# Patient Record
Sex: Female | Born: 1990 | Race: Black or African American | Hispanic: No | Marital: Single | Smoking: Never smoker
Health system: Southern US, Community
[De-identification: ages and names within clinical notes are randomized; demographics above are authoritative.]

## PROBLEM LIST (undated history)

## (undated) DIAGNOSIS — K589 Irritable bowel syndrome without diarrhea: Secondary | ICD-10-CM

## (undated) DIAGNOSIS — Z8619 Personal history of other infectious and parasitic diseases: Secondary | ICD-10-CM

## (undated) DIAGNOSIS — F419 Anxiety disorder, unspecified: Secondary | ICD-10-CM

## (undated) DIAGNOSIS — N200 Calculus of kidney: Secondary | ICD-10-CM

## (undated) DIAGNOSIS — I10 Essential (primary) hypertension: Secondary | ICD-10-CM

## (undated) DIAGNOSIS — J45909 Unspecified asthma, uncomplicated: Secondary | ICD-10-CM

## (undated) DIAGNOSIS — J329 Chronic sinusitis, unspecified: Secondary | ICD-10-CM

## (undated) DIAGNOSIS — K2900 Acute gastritis without bleeding: Secondary | ICD-10-CM

## (undated) DIAGNOSIS — K5900 Constipation, unspecified: Secondary | ICD-10-CM

## (undated) DIAGNOSIS — B009 Herpesviral infection, unspecified: Secondary | ICD-10-CM

## (undated) HISTORY — DX: Essential (primary) hypertension: I10

## (undated) HISTORY — DX: Irritable bowel syndrome, unspecified: K58.9

## (undated) HISTORY — DX: Personal history of other infectious and parasitic diseases: Z86.19

## (undated) HISTORY — DX: Calculus of kidney: N20.0

## (undated) HISTORY — DX: Anxiety disorder, unspecified: F41.9

## (undated) HISTORY — PX: COLONOSCOPY: SHX174

---
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
## 2013-07-02 ENCOUNTER — Encounter (HOSPITAL_COMMUNITY): Payer: Self-pay | Admitting: Emergency Medicine

## 2013-07-02 ENCOUNTER — Emergency Department (INDEPENDENT_AMBULATORY_CARE_PROVIDER_SITE_OTHER)
Admission: EM | Admit: 2013-07-02 | Discharge: 2013-07-02 | Disposition: A | Discharge status: Home / Self Care | Payer: BC Managed Care – PPO | Source: Home / Self Care | Attending: Family Medicine | Admitting: Family Medicine

## 2013-07-02 DIAGNOSIS — N390 Urinary tract infection, site not specified: Secondary | ICD-10-CM

## 2013-07-02 HISTORY — DX: Constipation, unspecified: K59.00

## 2013-07-02 HISTORY — DX: Acute gastritis without bleeding: K29.00

## 2013-07-02 LAB — POCT URINALYSIS DIP (DEVICE)
Bilirubin Urine: NEGATIVE
Glucose, UA: NEGATIVE mg/dL
Hgb urine dipstick: NEGATIVE
Ketones, ur: NEGATIVE mg/dL
Nitrite: NEGATIVE
Protein, ur: NEGATIVE mg/dL
Specific Gravity, Urine: 1.025 (ref 1.005–1.030)
Urobilinogen, UA: 0.2 mg/dL (ref 0.0–1.0)
pH: 7 (ref 5.0–8.0)

## 2013-07-02 MED ORDER — FLUCONAZOLE 150 MG PO TABS: 150.0000 mg | ORAL_TABLET | Freq: Every day | ORAL | Status: DC

## 2013-07-02 MED ORDER — NITROFURANTOIN MONOHYD MACRO 100 MG PO CAPS: 100.0000 mg | ORAL_CAPSULE | Freq: Two times a day (BID) | ORAL | Status: DC

## 2013-07-02 NOTE — ED Provider Notes (Signed)
CSN: [id number]     Arrival date & time 07/02/13  1943 History   First MD Initiated Contact with Patient 07/02/13 2048     Chief Complaint  Patient presents with  . Urinary Tract Infection     (Consider location/radiation/quality/duration/timing/severity/associated sxs/prior Treatment) Patient is a 23 y.o. female presenting with urinary tract infection. The history is provided by the patient. No language interpreter was used.  Urinary Tract Infection This is a new problem. The problem occurs constantly. The problem has not changed since onset.Pertinent negatives include no abdominal pain. Nothing aggravates the symptoms. Nothing relieves the symptoms. She has tried nothing for the symptoms.  Pt complains of burning with urination and a odor to urine  Past Medical History  Diagnosis Date  . Sinus infection   . Constipation   . Gastritis, acute   . Renal disorder 10/2011    kidney stone   History reviewed. No pertinent past surgical history. Family History  Problem Relation Age of Onset  . Cancer Mother     uterus   History  Substance Use Topics  . Smoking status: Never Smoker   . Smokeless tobacco: Not on file  . Alcohol Use: Yes     Comment: occasional   OB History   Grav Para Term Preterm Abortions TAB SAB Ect Mult Living                 Review of Systems  Gastrointestinal: Negative for abdominal pain.  Genitourinary: Positive for urgency.  All other systems reviewed and are negative.      Allergies  Amoxicillin  Home Medications   Current Outpatient Rx  Name  Route  Sig  Dispense  Refill  . amoxicillin (AMOXIL) 500 MG capsule   Oral   Take 500 mg by mouth daily. Take for 10 days         . diphenhydrAMINE (BENADRYL) 25 mg capsule   Oral   Take 25 mg by mouth every 6 (six) hours as needed. For allergic reaction         . EPINEPHrine (EPIPEN 2-PAK) 0.3 mg/0.3 mL DEVI   Intramuscular   Inject 0.3 mg into the muscle once. For allergic reaction        . predniSONE (DELTASONE) 20 MG tablet   Oral   Take 20-60 mg by mouth See admin instructions. Take 3 tabs daily x2 days, 2 tabs daily x2 days & 1 tab daily x2 days          BP 126/77  Pulse 84  Temp(Src) 98.2 F (36.8 C) (Oral)  Resp 18  SpO2 100%  LMP 06/16/2013 Physical Exam  Nursing note and vitals reviewed. Constitutional: She appears well-developed and well-nourished.  HENT:  Head: Normocephalic and atraumatic.  Eyes: Pupils are equal, round, and reactive to light.  Neck: Normal range of motion. Neck supple.  Cardiovascular: Normal rate and normal heart sounds.   Pulmonary/Chest: Effort normal and breath sounds normal.  Abdominal: Soft.  Neurological: She is alert.  Skin: Skin is warm.    ED Course  Procedures (including critical care time) Labs Review Labs Reviewed - No data to display Imaging Review No results found.    MDM   Final diagnoses:  None    Diflucan and Macrobid.       Lonia SkinnerLeslie K DellSofia, PA-C 07/02/13 2135

## 2013-07-02 NOTE — Discharge Instructions (Signed)
Urinary Tract Infection  Urinary tract infections (UTIs) can develop anywhere along your urinary tract. Your urinary tract is your body's drainage system for removing wastes and extra water. Your urinary tract includes two kidneys, two ureters, a bladder, and a urethra. Your kidneys are a pair of bean-shaped organs. Each kidney is about the size of your fist. They are located below your ribs, one on each side of your spine.  CAUSES  Infections are caused by microbes, which are microscopic organisms, including fungi, viruses, and bacteria. These organisms are so small that they can only be seen through a microscope. Bacteria are the microbes that most commonly cause UTIs.  SYMPTOMS   Symptoms of UTIs may vary by age and gender of the patient and by the location of the infection. Symptoms in young women typically include a frequent and intense urge to urinate and a painful, burning feeling in the bladder or urethra during urination. Older women and men are more likely to be tired, shaky, and weak and have muscle aches and abdominal pain. A fever may mean the infection is in your kidneys. Other symptoms of a kidney infection include pain in your back or sides below the ribs, nausea, and vomiting.  DIAGNOSIS  To diagnose a UTI, your caregiver will ask you about your symptoms. Your caregiver also will ask to provide a urine sample. The urine sample will be tested for bacteria and white blood cells. White blood cells are made by your body to help fight infection.  TREATMENT   Typically, UTIs can be treated with medication. Because most UTIs are caused by a bacterial infection, they usually can be treated with the use of antibiotics. The choice of antibiotic and length of treatment depend on your symptoms and the type of bacteria causing your infection.  HOME CARE INSTRUCTIONS   If you were prescribed antibiotics, take them exactly as your caregiver instructs you. Finish the medication even if you feel better after you  have only taken some of the medication.   Drink enough water and fluids to keep your urine clear or pale yellow.   Avoid caffeine, tea, and carbonated beverages. They tend to irritate your bladder.   Empty your bladder often. Avoid holding urine for long periods of time.   Empty your bladder before and after sexual intercourse.   After a bowel movement, women should cleanse from front to back. Use each tissue only once.  SEEK MEDICAL CARE IF:    You have back pain.   You develop a fever.   Your symptoms do not begin to resolve within 3 days.  SEEK IMMEDIATE MEDICAL CARE IF:    You have severe back pain or lower abdominal pain.   You develop chills.   You have nausea or vomiting.   You have continued burning or discomfort with urination.  MAKE SURE YOU:    Understand these instructions.   Will watch your condition.   Will get help right away if you are not doing well or get worse.  Document Released: 02/01/2005 Document Revised: 10/24/2011 Document Reviewed: 06/02/2011  ExitCare Patient Information 2014 [employer].

## 2013-07-02 NOTE — ED Notes (Signed)
Had  UTI in Dec.  Took antibiotics and got a yeast infection and took a Diflucan ( late Dec.).  End January she burning with urination off and on, low back pain, low abd. pain and urine has an odor.  No hematuria.

## 2013-07-04 NOTE — ED Provider Notes (Signed)
Medical screening examination/treatment/procedure(s) were performed by a resident physician or non-physician practitioner and as the supervising physician I was immediately available for consultation/collaboration.  Kyeisha Janowicz, MD    Janellie Tennison S Ileigh Mettler, MD 07/04/13 0743 

## 2013-07-07 ENCOUNTER — Other Ambulatory Visit (HOSPITAL_COMMUNITY)
Admission: RE | Admit: 2013-07-07 | Discharge: 2013-07-07 | Disposition: A | Discharge status: Home / Self Care | Payer: BC Managed Care – PPO | Source: Ambulatory Visit | Attending: Emergency Medicine | Admitting: Emergency Medicine

## 2013-07-07 ENCOUNTER — Encounter (HOSPITAL_COMMUNITY): Payer: Self-pay | Admitting: Emergency Medicine

## 2013-07-07 ENCOUNTER — Emergency Department (INDEPENDENT_AMBULATORY_CARE_PROVIDER_SITE_OTHER)
Admission: EM | Admit: 2013-07-07 | Discharge: 2013-07-07 | Disposition: A | Discharge status: Home / Self Care | Payer: BC Managed Care – PPO | Source: Home / Self Care | Attending: Emergency Medicine | Admitting: Emergency Medicine

## 2013-07-07 DIAGNOSIS — N760 Acute vaginitis: Secondary | ICD-10-CM

## 2013-07-07 DIAGNOSIS — N730 Acute parametritis and pelvic cellulitis: Secondary | ICD-10-CM

## 2013-07-07 DIAGNOSIS — Z113 Encounter for screening for infections with a predominantly sexual mode of transmission: Secondary | ICD-10-CM | POA: Insufficient documentation

## 2013-07-07 DIAGNOSIS — N390 Urinary tract infection, site not specified: Secondary | ICD-10-CM

## 2013-07-07 LAB — HIV ANTIBODY (ROUTINE TESTING W REFLEX): HIV: NONREACTIVE

## 2013-07-07 LAB — POCT URINALYSIS DIP (DEVICE)
Bilirubin Urine: NEGATIVE
Glucose, UA: NEGATIVE mg/dL
Hgb urine dipstick: NEGATIVE
Ketones, ur: NEGATIVE mg/dL
Leukocytes, UA: NEGATIVE
Nitrite: NEGATIVE
Protein, ur: NEGATIVE mg/dL
Specific Gravity, Urine: 1.025 (ref 1.005–1.030)
Urobilinogen, UA: 0.2 mg/dL (ref 0.0–1.0)
pH: 6 (ref 5.0–8.0)

## 2013-07-07 LAB — POCT PREGNANCY, URINE: Preg Test, Ur: NEGATIVE

## 2013-07-07 MED ORDER — METRONIDAZOLE 500 MG PO TABS: 500.0000 mg | ORAL_TABLET | Freq: Three times a day (TID) | ORAL | Status: DC

## 2013-07-07 MED ORDER — CEFTRIAXONE SODIUM 250 MG IJ SOLR
250.0000 mg | Freq: Once | INTRAMUSCULAR | Status: AC
  Administered 2013-07-07: 250 mg via INTRAMUSCULAR

## 2013-07-07 MED ORDER — AZITHROMYCIN 250 MG PO TABS
ORAL_TABLET | ORAL | Status: AC
  Filled 2013-07-07: qty 4

## 2013-07-07 MED ORDER — AZITHROMYCIN 250 MG PO TABS
ORAL_TABLET | ORAL | Status: AC
  Filled 2013-07-07: qty 1

## 2013-07-07 MED ORDER — ONDANSETRON HCL 8 MG PO TABS: 8.0000 mg | ORAL_TABLET | Freq: Three times a day (TID) | ORAL | Status: DC | PRN

## 2013-07-07 MED ORDER — CEPHALEXIN 500 MG PO CAPS: 500.0000 mg | ORAL_CAPSULE | Freq: Three times a day (TID) | ORAL | Status: DC

## 2013-07-07 MED ORDER — LIDOCAINE HCL (PF) 1 % IJ SOLN
INTRAMUSCULAR | Status: AC
  Filled 2013-07-07: qty 5

## 2013-07-07 MED ORDER — AZITHROMYCIN 250 MG PO TABS
1000.0000 mg | ORAL_TABLET | Freq: Once | ORAL | Status: AC
  Administered 2013-07-07: 1000 mg via ORAL

## 2013-07-07 MED ORDER — CEFTRIAXONE SODIUM 250 MG IJ SOLR
INTRAMUSCULAR | Status: AC
  Filled 2013-07-07: qty 250

## 2013-07-07 NOTE — ED Notes (Signed)
Pt waiting blood work.

## 2013-07-07 NOTE — Discharge Instructions (Signed)
Bacterial Vaginosis °Bacterial vaginosis is a vaginal infection that occurs when the normal balance of bacteria in the vagina is disrupted. It results from an overgrowth of certain bacteria. This is the most common vaginal infection in women of childbearing age. Treatment is important to prevent complications, especially in pregnant women, as it can cause a premature delivery. °CAUSES  °Bacterial vaginosis is caused by an increase in harmful bacteria that are normally present in smaller amounts in the vagina. Several different kinds of bacteria can cause bacterial vaginosis. However, the reason that the condition develops is not fully understood. °RISK FACTORS °Certain activities or behaviors can put you at an increased risk of developing bacterial vaginosis, including: °· Having a new sex partner or multiple sex partners. °· Douching. °· Using an intrauterine device (IUD) for contraception. °Women do not get bacterial vaginosis from toilet seats, bedding, swimming pools, or contact with objects around them. °SIGNS AND SYMPTOMS  °Some women with bacterial vaginosis have no signs or symptoms. Common symptoms include: °· Grey vaginal discharge. °· A fishlike odor with discharge, especially after sexual intercourse. °· Itching or burning of the vagina and vulva. °· Burning or pain with urination. °DIAGNOSIS  °Your health care provider will take a medical history and examine the vagina for signs of bacterial vaginosis. A sample of vaginal fluid may be taken. Your health care provider will look at this sample under a microscope to check for bacteria and abnormal cells. A vaginal pH test may also be done.  °TREATMENT  °Bacterial vaginosis may be treated with antibiotic medicines. These may be given in the form of a pill or a vaginal cream. A second round of antibiotics may be prescribed if the condition comes back after treatment.  °HOME CARE INSTRUCTIONS  °· Only take over-the-counter or prescription medicines as  directed by your health care provider. °· If antibiotic medicine was prescribed, take it as directed. Make sure you finish it even if you start to feel better. °· Do not have sex until treatment is completed. °· Tell all sexual partners that you have a vaginal infection. They should see their health care provider and be treated if they have problems, such as a mild rash or itching. °· Practice safe sex by using condoms and only having one sex partner. °SEEK MEDICAL CARE IF:  °· Your symptoms are not improving after 3 days of treatment. °· You have increased discharge or pain. °· You have a fever. °MAKE SURE YOU:  °· Understand these instructions. °· Will watch your condition. °· Will get help right away if you are not doing well or get worse. °FOR MORE INFORMATION  °Centers for Disease Control and Prevention, Division of STD Prevention: www.cdc.gov/std °American Sexual Health Association (ASHA): www.ashastd.org  °Document Released: 04/24/2005 Document Revised: 02/12/2013 Document Reviewed: 12/04/2012 °ExitCare® Patient Information ©2014 [employer]. ° °Urinary Tract Infection °Urinary tract infections (UTIs) can develop anywhere along your urinary tract. Your urinary tract is your body's drainage system for removing wastes and extra water. Your urinary tract includes two kidneys, two ureters, a bladder, and a urethra. Your kidneys are a pair of bean-shaped organs. Each kidney is about the size of your fist. They are located below your ribs, one on each side of your spine. °CAUSES °Infections are caused by microbes, which are microscopic organisms, including fungi, viruses, and bacteria. These organisms are so small that they can only be seen through a microscope. Bacteria are the microbes that most commonly cause UTIs. °SYMPTOMS  °Symptoms of   UTIs may vary by age and gender of the patient and by the location of the infection. Symptoms in young women typically include a frequent and intense urge to urinate and a  painful, burning feeling in the bladder or urethra during urination. Older women and men are more likely to be tired, shaky, and weak and have muscle aches and abdominal pain. A fever may mean the infection is in your kidneys. Other symptoms of a kidney infection include pain in your back or sides below the ribs, nausea, and vomiting. °DIAGNOSIS °To diagnose a UTI, your caregiver will ask you about your symptoms. Your caregiver also will ask to provide a urine sample. The urine sample will be tested for bacteria and white blood cells. White blood cells are made by your body to help fight infection. °TREATMENT  °Typically, UTIs can be treated with medication. Because most UTIs are caused by a bacterial infection, they usually can be treated with the use of antibiotics. The choice of antibiotic and length of treatment depend on your symptoms and the type of bacteria causing your infection. °HOME CARE INSTRUCTIONS °· If you were prescribed antibiotics, take them exactly as your caregiver instructs you. Finish the medication even if you feel better after you have only taken some of the medication. °· Drink enough water and fluids to keep your urine clear or pale yellow. °· Avoid caffeine, tea, and carbonated beverages. They tend to irritate your bladder. °· Empty your bladder often. Avoid holding urine for long periods of time. °· Empty your bladder before and after sexual intercourse. °· After a bowel movement, women should cleanse from front to back. Use each tissue only once. °SEEK MEDICAL CARE IF:  °· You have back pain. °· You develop a fever. °· Your symptoms do not begin to resolve within 3 days. °SEEK IMMEDIATE MEDICAL CARE IF:  °· You have severe back pain or lower abdominal pain. °· You develop chills. °· You have nausea or vomiting. °· You have continued burning or discomfort with urination. °MAKE SURE YOU:  °· Understand these instructions. °· Will watch your condition. °· Will get help right away if you are  not doing well or get worse. °Document Released: 02/01/2005 Document Revised: 10/24/2011 Document Reviewed: 06/02/2011 °ExitCare® Patient Information ©2014 [employer]. ° ° °To restore the normal balance of "good bacteria" in your system.  Take a probiotic once daily.  These can be gotten over the counter at the drug store without a prescription and come under various brand names such as Culturelle, Align, Florastore, and Phillips.  The best thing to do is to ask your pharmacist to recommend a good probiotic that is not too expensive.  ° °

## 2013-07-07 NOTE — ED Provider Notes (Addendum)
Chief Complaint   Chief Complaint  Patient presents with  . Allergic Reaction    History of Present Illness   Christy Little is a 23 year old female who was here on February 25 with back and abdominal pain, dysuria, and malodorous urine. Her UA showed a trace of WBCs, but was otherwise negative. She did not have urine culture and a pelvic exam was not done. She was given Macrobid and Diflucan. She took the Macrobid but not the Diflucan. She felt like she was getting somewhat better up until this past Saturday, 3 days ago, when she developed nausea. She stopped the Macrobid, thinking that this was causing her nausea. The nausea is better right now. The patient states even the smell of food cooking would make her nauseous. She continues to have bilateral pelvic pain, dysuria, urgency, vaginal discharge, itching, and odor. She also had some diarrhea and borborygmus as well. Her last menstrual period was February 29. She is sexually active with consistent use of condoms.  Review of Systems   Other than as noted above, the patient denies any of the following symptoms: Constitutional:  No fever, chills, weight loss or anorexia. Lungs:  No cough or shortness of breath. Heart:  No chest pain, palpitations, syncope or edema.  Abdomen:  No nausea, vomiting, hematememesis, melena, diarrhea, or hematochezia. GU:  No dysuria, frequency, urgency, or hematuria. Gyn:  No vaginal discharge, itching, abnormal bleeding, dyspareunia, or pelvic pain.  PMFSH   Past medical history, family history, social history, meds, and allergies were reviewed. She is allergic to amoxicillin. She has chronic constipation and gastritis.  Physical Exam     Vital signs:  BP 114/61  Pulse 83  Temp(Src) 99.5 F (37.5 C) (Oral)  Resp 12  SpO2 100%  LMP 06/16/2013 Gen:  Alert, oriented, in no distress. Lungs:  Breath sounds clear and equal bilaterally.  No wheezes, rales or rhonchi. Heart:  Regular rhythm.  No gallops or  murmers.   Abdomen:  Soft, flat, nondistended. No organomegaly or mass, bowel sounds are normally active. She has bilateral pelvic pain to palpation. No guarding or rebound. Pelvic:  Normal external genitalia, vaginal and cervical mucosa were normal. There is a copious, white, homogeneous, malodorous vaginal discharge. She has mild pelvic pain on movement of the cervix, moderate pain to palpation of the uterus and both adnexa, normal size uterus, and no adnexal masses.  DNA probes for gonorrhea, Chlamydia, Trichomonas, Gardnerella, and Candida were obtained. Skin:  Clear, warm and dry.  No rash.  Labs   Results for orders placed during the hospital encounter of 07/07/13  POCT URINALYSIS DIP (DEVICE)      Result Value Ref Range   Glucose, UA NEGATIVE  NEGATIVE mg/dL   Bilirubin Urine NEGATIVE  NEGATIVE   Ketones, ur NEGATIVE  NEGATIVE mg/dL   Specific Gravity, Urine 1.025  1.005 - 1.030   Hgb urine dipstick NEGATIVE  NEGATIVE   pH 6.0  5.0 - 8.0   Protein, ur NEGATIVE  NEGATIVE mg/dL   Urobilinogen, UA 0.2  0.0 - 1.0 mg/dL   Nitrite NEGATIVE  NEGATIVE   Leukocytes, UA NEGATIVE  NEGATIVE  POCT PREGNANCY, URINE      Result Value Ref Range   Preg Test, Ur NEGATIVE  NEGATIVE    Urine was cultured.  Course in [facility]gent [facility] Center   Given Rocephin 250 mg IM and azithromycin 1000 mg by mouth.  Assessment   The primary encounter diagnosis was UTI (lower urinary tract infection). Diagnoses  of Vaginitis and PID (acute pelvic inflammatory disease) were also pertinent to this visit.  Plan     1.  Meds:  The following meds were prescribed:   New Prescriptions   CEPHALEXIN (KEFLEX) 500 MG CAPSULE    Take 1 capsule (500 mg total) by mouth 3 (three) times daily.   METRONIDAZOLE (FLAGYL) 500 MG TABLET    Take 1 tablet (500 mg total) by mouth 3 (three) times daily.   ONDANSETRON (ZOFRAN) 8 MG TABLET    Take 1 tablet (8 mg total) by mouth every 8 (eight) hours as needed for nausea or vomiting.     2.  Patient Education/Counseling:  The patient was given appropriate handouts, self care instructions, and instructed in symptomatic relief.  Suggested extra liquids and use of a probiotic.  3.  Follow up:  The patient was told to follow up here if no better in 3 to 4 days, or sooner if becoming worse in any way, and given some red flag symptoms such as worsening pain, fever, vomiting, or evidence of GI bleeding which would prompt immediate return.  Follow up here as necessary.    Reuben Likesavid C Londell Noll, MD 07/07/13 [phone]  Addendum: After receiving the Rocephin and azithromycin, she noted a sensation of a lump in her throat, chest pain, abdominal pain, and some itching on her buttocks. She did not have a rash. She was allowed to sit in the exam room for about 30 minutes. Thereafter her symptoms resolved. She was able to drink ginger ale and ate some graham crackers. She did not have any respiratory distress. She did not have any skin rash. Her lungs remained clear, and her oropharynx was clear. I think her symptoms may have been a relative intolerance to the azithromycin. I do not think she had an allergic reaction to the Rocephin. After about half an hour she felt back to normal, and was allowed to leave. I stressed the importance of returning if she should have any further symptoms.  Reuben Likesavid C Conita Amenta, MD 07/07/13 [phone]

## 2013-07-07 NOTE — ED Notes (Signed)
Pt given injection will discharge at 4:26.  Mw,cma

## 2013-07-07 NOTE — ED Notes (Signed)
Reports possible allergic reaction to meds prescribed on 2/25.  States having nausea and loss of appetite.  uti symptoms gradually gotten worse.

## 2013-07-08 LAB — CERVICOVAGINAL ANCILLARY ONLY
Chlamydia: NEGATIVE
Neisseria Gonorrhea: NEGATIVE
Wet Prep (BD Affirm): NEGATIVE
Wet Prep (BD Affirm): POSITIVE — AB
Wet Prep (BD Affirm): POSITIVE — AB

## 2013-07-08 LAB — URINE CULTURE
Colony Count: NO GROWTH
Culture: NO GROWTH
Special Requests: NORMAL

## 2013-07-09 NOTE — ED Notes (Signed)
GC/Chlamydia neg., Affirm: Candida and Gardnerella pos., Trich neg., HIV non-reactive, Urine culture: No growth.  Pt. adequately treated with Flagyl.  Message sent to Dr. Lorenz CoasterKeller. Christy MoselleYork, Christy Little 07/09/2013

## 2013-07-10 ENCOUNTER — Telehealth (HOSPITAL_COMMUNITY): Payer: Self-pay | Admitting: *Deleted

## 2013-07-10 ENCOUNTER — Telehealth (HOSPITAL_COMMUNITY): Payer: Self-pay | Admitting: Emergency Medicine

## 2013-07-10 MED ORDER — FLUCONAZOLE 150 MG PO TABS: 150.0000 mg | ORAL_TABLET | Freq: Once | ORAL | Status: DC

## 2013-07-10 NOTE — ED Notes (Addendum)
Dr.  Lorenz CoasterKeller [facility] [address]  I called pt. and left a message to call. Call 1. Vassie MoselleYork, Shirlette Scarber M 07/10/2013 Pt. called back.  Pt. verified x 2 and given results.  Pt. told to finish all of Flagyl for bacterial vaginosis and take Diflucan for the yeast infection. Pt. voiced understanding. Vassie MoselleYork, Jayvyn Haselton M 07/10/2013

## 2013-07-10 NOTE — Telephone Encounter (Signed)
Message copied by Reuben LikesKELLER, Seynabou Fults C on Thu Jul 10, 2013 12:47 PM ------      Message from: Vassie MoselleYORK, SUZANNE M      Created: Wed Jul 09, 2013  3:46 PM      Regarding: lab       Candida and Gardnerella pos. Rest of labs neg.  Treated with Flagyl.  Do you want to treat the other?      Vassie MoselleYork, Suzanne M      07/09/2013       ------

## 2013-07-10 NOTE — ED Notes (Signed)
DNA probe was positive for Candida. We'll treat with Diflucan 150 mg, #1, one tablet one time only. Will need to call the patient and inform her of this result.  Reuben Likesavid C Lawson Isabell, MD 07/10/13 1248

## 2013-11-18 ENCOUNTER — Encounter (HOSPITAL_COMMUNITY): Payer: Self-pay | Admitting: Emergency Medicine

## 2013-11-18 ENCOUNTER — Emergency Department (INDEPENDENT_AMBULATORY_CARE_PROVIDER_SITE_OTHER)
Admission: EM | Admit: 2013-11-18 | Discharge: 2013-11-18 | Disposition: A | Discharge status: Home / Self Care | Payer: BC Managed Care – PPO | Source: Home / Self Care

## 2013-11-18 DIAGNOSIS — J069 Acute upper respiratory infection, unspecified: Secondary | ICD-10-CM

## 2013-11-18 DIAGNOSIS — R350 Frequency of micturition: Secondary | ICD-10-CM

## 2013-11-18 LAB — POCT URINALYSIS DIP (DEVICE)
Bilirubin Urine: NEGATIVE
Glucose, UA: NEGATIVE mg/dL
Hgb urine dipstick: NEGATIVE
Ketones, ur: NEGATIVE mg/dL
Leukocytes, UA: NEGATIVE
Nitrite: NEGATIVE
Protein, ur: NEGATIVE mg/dL
Specific Gravity, Urine: 1.01 (ref 1.005–1.030)
Urobilinogen, UA: 0.2 mg/dL (ref 0.0–1.0)
pH: 6 (ref 5.0–8.0)

## 2013-11-18 LAB — POCT PREGNANCY, URINE: Preg Test, Ur: NEGATIVE

## 2013-11-18 MED ORDER — IPRATROPIUM BROMIDE 0.06 % NA SOLN: 2.0000 | Freq: Four times a day (QID) | NASAL | Status: DC

## 2013-11-18 MED ORDER — CETIRIZINE HCL 10 MG PO TABS: 10.0000 mg | ORAL_TABLET | Freq: Every day | ORAL | Status: DC

## 2013-11-18 NOTE — ED Provider Notes (Signed)
CSN: [id number]     Arrival date & time 11/18/13  1502 History   None    Chief Complaint  Patient presents with  . URI   (Consider location/radiation/quality/duration/timing/severity/associated sxs/prior Treatment) Patient is a 23 y.o. female presenting with URI. The history is provided by the patient.  URI Presenting symptoms: congestion, cough, rhinorrhea and sore throat   Presenting symptoms: no fever   Severity:  Moderate Onset quality:  Gradual Duration:  2 days Progression:  Worsening Chronicity:  Recurrent Relieved by:  None tried Worsened by:  Nothing tried Ineffective treatments:  None tried Associated symptoms: sneezing     Past Medical History  Diagnosis Date  . Sinus infection   . Constipation   . Gastritis, acute   . Renal disorder 10/2011    kidney stone   History reviewed. No pertinent past surgical history. Family History  Problem Relation Age of Onset  . Cancer Mother     uterus   History  Substance Use Topics  . Smoking status: Never Smoker   . Smokeless tobacco: Not on file  . Alcohol Use: Yes     Comment: occasional   OB History   Grav Para Term Preterm Abortions TAB SAB Ect Mult Living                 Review of Systems  Constitutional: Negative.  Negative for fever.  HENT: Positive for congestion, postnasal drip, rhinorrhea, sneezing and sore throat.   Respiratory: Positive for cough.   Gastrointestinal: Negative.   Genitourinary: Positive for dysuria and frequency. Negative for urgency, menstrual problem and pelvic pain.  Musculoskeletal: Positive for back pain.    Allergies  Amoxicillin  Home Medications   Prior to Admission medications   Medication Sig Start Date End Date Taking? Authorizing Provider  cephALEXin (KEFLEX) 500 MG capsule Take 1 capsule (500 mg total) by mouth 3 (three) times daily. 07/07/13   Reuben Likes, MD  cetirizine (ZYRTEC) 10 MG tablet Take 1 tablet (10 mg total) by mouth daily. 11/18/13   Linna Hoff, MD   diphenhydrAMINE (BENADRYL) 25 mg capsule Take 25 mg by mouth every 6 (six) hours as needed. For allergic reaction    Historical Provider, MD  EPINEPHrine (EPIPEN 2-PAK) 0.3 mg/0.3 mL DEVI Inject 0.3 mg into the muscle once. For allergic reaction    Historical Provider, MD  fluconazole (DIFLUCAN) 150 MG tablet Take 1 tablet (150 mg total) by mouth daily. 07/02/13   Elson Areas, PA-C  fluconazole (DIFLUCAN) 150 MG tablet Take 1 tablet (150 mg total) by mouth once. 07/10/13   Reuben Likes, MD  ipratropium (ATROVENT) 0.06 % nasal spray Place 2 sprays into both nostrils 4 (four) times daily. 11/18/13   Linna Hoff, MD  metroNIDAZOLE (FLAGYL) 500 MG tablet Take 1 tablet (500 mg total) by mouth 3 (three) times daily. 07/07/13   Reuben Likes, MD  nitrofurantoin, macrocrystal-monohydrate, (MACROBID) 100 MG capsule Take 1 capsule (100 mg total) by mouth 2 (two) times daily. 07/02/13   Elson Areas, PA-C  ondansetron (ZOFRAN) 8 MG tablet Take 1 tablet (8 mg total) by mouth every 8 (eight) hours as needed for nausea or vomiting. 07/07/13   Reuben Likes, MD  predniSONE (DELTASONE) 20 MG tablet Take 20-60 mg by mouth See admin instructions. Take 3 tabs daily x2 days, 2 tabs daily x2 days & 1 tab daily x2 days    Historical Provider, MD   BP 126/80  Pulse 92  Temp(Src) 99.8 F (37.7 C) (Oral)  Resp 14  SpO2 100%  LMP 11/17/2013 Physical Exam  Nursing note and vitals reviewed. Constitutional: She is oriented to person, place, and time. She appears well-developed and well-nourished.  HENT:  Right Ear: External ear normal.  Left Ear: External ear normal.  Nose: Mucosal edema and rhinorrhea present.  Mouth/Throat: Mucous membranes are normal. Posterior oropharyngeal erythema present. No oropharyngeal exudate.  Neck: Normal range of motion. Neck supple.  Abdominal: Soft. Bowel sounds are normal. There is no tenderness.  Lymphadenopathy:    She has no cervical adenopathy.  Neurological: She is alert  and oriented to person, place, and time.  Skin: Skin is warm and dry.    ED Course  Procedures (including critical care time) Labs Review Labs Reviewed  POCT URINALYSIS DIP (DEVICE)  POCT PREGNANCY, URINE   U/a neg. Imaging Review No results found.   MDM   1. URI (upper respiratory infection)   2. Urinary frequency        Linna HoffJames D Lamberto Dinapoli, MD 11/18/13 1635

## 2013-11-18 NOTE — ED Notes (Signed)
Pt c/o cold/sinus sx x 2 days Sx include: runny nose, abd/back pain, HA, ST Denies f/v/n/d Taking tyle w/no relief Alert w/no signs of acute distress.

## 2013-11-18 NOTE — Discharge Instructions (Signed)
Drink plenty of fluids as discussed, use medicine as prescribed, . Empty bladder after sexual intercourse,  Return or see your doctor if further problems

## 2014-04-19 ENCOUNTER — Encounter (HOSPITAL_COMMUNITY): Payer: Self-pay

## 2014-04-19 ENCOUNTER — Emergency Department (HOSPITAL_COMMUNITY)
Admission: EM | Admit: 2014-04-19 | Discharge: 2014-04-19 | Disposition: A | Discharge status: Home / Self Care | Payer: BC Managed Care – PPO | Attending: Emergency Medicine | Admitting: Emergency Medicine

## 2014-04-19 DIAGNOSIS — N12 Tubulo-interstitial nephritis, not specified as acute or chronic: Secondary | ICD-10-CM | POA: Insufficient documentation

## 2014-04-19 DIAGNOSIS — Z792 Long term (current) use of antibiotics: Secondary | ICD-10-CM | POA: Insufficient documentation

## 2014-04-19 DIAGNOSIS — Z880 Allergy status to penicillin: Secondary | ICD-10-CM | POA: Insufficient documentation

## 2014-04-19 DIAGNOSIS — Z87440 Personal history of urinary (tract) infections: Secondary | ICD-10-CM | POA: Insufficient documentation

## 2014-04-19 DIAGNOSIS — Z87442 Personal history of urinary calculi: Secondary | ICD-10-CM | POA: Diagnosis not present

## 2014-04-19 DIAGNOSIS — Z79899 Other long term (current) drug therapy: Secondary | ICD-10-CM | POA: Insufficient documentation

## 2014-04-19 DIAGNOSIS — R1011 Right upper quadrant pain: Secondary | ICD-10-CM | POA: Diagnosis present

## 2014-04-19 DIAGNOSIS — Z8619 Personal history of other infectious and parasitic diseases: Secondary | ICD-10-CM | POA: Diagnosis not present

## 2014-04-19 DIAGNOSIS — Z8709 Personal history of other diseases of the respiratory system: Secondary | ICD-10-CM | POA: Insufficient documentation

## 2014-04-19 DIAGNOSIS — Z8719 Personal history of other diseases of the digestive system: Secondary | ICD-10-CM | POA: Insufficient documentation

## 2014-04-19 DIAGNOSIS — Z3202 Encounter for pregnancy test, result negative: Secondary | ICD-10-CM | POA: Diagnosis not present

## 2014-04-19 LAB — CBC WITH DIFFERENTIAL/PLATELET
Basophils Absolute: 0 10*3/uL (ref 0.0–0.1)
Basophils Relative: 0 % (ref 0–1)
Eosinophils Absolute: 0 10*3/uL (ref 0.0–0.7)
Eosinophils Relative: 0 % (ref 0–5)
HCT: 41.7 % (ref 36.0–46.0)
Hemoglobin: 13.8 g/dL (ref 12.0–15.0)
Lymphocytes Relative: 11 % — ABNORMAL LOW (ref 12–46)
Lymphs Abs: 1.5 10*3/uL (ref 0.7–4.0)
MCH: 27.5 pg (ref 26.0–34.0)
MCHC: 33.1 g/dL (ref 30.0–36.0)
MCV: 83.2 fL (ref 78.0–100.0)
Monocytes Absolute: 1.6 10*3/uL — ABNORMAL HIGH (ref 0.1–1.0)
Monocytes Relative: 11 % (ref 3–12)
Neutro Abs: 11 10*3/uL — ABNORMAL HIGH (ref 1.7–7.7)
Neutrophils Relative %: 78 % — ABNORMAL HIGH (ref 43–77)
Platelets: 217 10*3/uL (ref 150–400)
RBC: 5.01 MIL/uL (ref 3.87–5.11)
RDW: 13.8 % (ref 11.5–15.5)
WBC: 14.2 10*3/uL — ABNORMAL HIGH (ref 4.0–10.5)

## 2014-04-19 LAB — URINALYSIS, ROUTINE W REFLEX MICROSCOPIC
Bilirubin Urine: NEGATIVE
Glucose, UA: NEGATIVE mg/dL
Ketones, ur: NEGATIVE mg/dL
Nitrite: NEGATIVE
Protein, ur: 100 mg/dL — AB
Specific Gravity, Urine: 1.018 (ref 1.005–1.030)
Urobilinogen, UA: 0.2 mg/dL (ref 0.0–1.0)
pH: 6 (ref 5.0–8.0)

## 2014-04-19 LAB — RPR

## 2014-04-19 LAB — COMPREHENSIVE METABOLIC PANEL
ALT: 8 U/L (ref 0–35)
AST: 17 U/L (ref 0–37)
Albumin: 4.3 g/dL (ref 3.5–5.2)
Alkaline Phosphatase: 67 U/L (ref 39–117)
Anion gap: 19 — ABNORMAL HIGH (ref 5–15)
BUN: 7 mg/dL (ref 6–23)
CO2: 21 mEq/L (ref 19–32)
Calcium: 9.8 mg/dL (ref 8.4–10.5)
Chloride: 96 mEq/L (ref 96–112)
Creatinine, Ser: 0.97 mg/dL (ref 0.50–1.10)
GFR calc Af Amer: >90 mL/min (ref >90)
GFR calc non Af Amer: 83 mL/min — ABNORMAL LOW (ref >90)
Glucose, Bld: 94 mg/dL (ref 70–99)
Potassium: 3.1 mEq/L — ABNORMAL LOW (ref 3.7–5.3)
Sodium: 136 mEq/L — ABNORMAL LOW (ref 137–147)
Total Bilirubin: 1.7 mg/dL — ABNORMAL HIGH (ref 0.3–1.2)
Total Protein: 9 g/dL — ABNORMAL HIGH (ref 6.0–8.3)

## 2014-04-19 LAB — URINE MICROSCOPIC-ADD ON

## 2014-04-19 LAB — WET PREP, GENITAL
Clue Cells Wet Prep HPF POC: NONE SEEN
Trich, Wet Prep: NONE SEEN

## 2014-04-19 LAB — POC URINE PREG, ED: Preg Test, Ur: NEGATIVE

## 2014-04-19 LAB — LIPASE, BLOOD: Lipase: 11 U/L (ref 11–59)

## 2014-04-19 MED ORDER — SODIUM CHLORIDE 0.9 % IV BOLUS (SEPSIS)
1000.0000 mL | Freq: Once | INTRAVENOUS | Status: AC
  Administered 2014-04-19: 1000 mL via INTRAVENOUS

## 2014-04-19 MED ORDER — ACETAMINOPHEN 500 MG PO TABS
1000.0000 mg | ORAL_TABLET | Freq: Once | ORAL | Status: AC
  Administered 2014-04-19: 1000 mg via ORAL
  Filled 2014-04-19: qty 2

## 2014-04-19 MED ORDER — CEFTRIAXONE SODIUM 1 G IJ SOLR
1.0000 g | Freq: Once | INTRAMUSCULAR | Status: AC
  Administered 2014-04-19: 1 g via INTRAVENOUS
  Filled 2014-04-19: qty 10

## 2014-04-19 MED ORDER — CIPROFLOXACIN HCL 500 MG PO TABS: 500.0000 mg | ORAL_TABLET | Freq: Two times a day (BID) | ORAL | Status: DC

## 2014-04-19 MED ORDER — ONDANSETRON HCL 4 MG/2ML IJ SOLN
4.0000 mg | Freq: Once | INTRAMUSCULAR | Status: AC
  Administered 2014-04-19: 4 mg via INTRAVENOUS
  Filled 2014-04-19: qty 2

## 2014-04-19 MED ORDER — POTASSIUM CHLORIDE CRYS ER 20 MEQ PO TBCR
40.0000 meq | EXTENDED_RELEASE_TABLET | Freq: Once | ORAL | Status: AC
  Administered 2014-04-19: 40 meq via ORAL
  Filled 2014-04-19: qty 2

## 2014-04-19 MED ORDER — HYDROMORPHONE HCL 1 MG/ML IJ SOLN
0.5000 mg | Freq: Once | INTRAMUSCULAR | Status: AC
  Administered 2014-04-19: 0.5 mg via INTRAVENOUS
  Filled 2014-04-19: qty 1

## 2014-04-19 MED ORDER — MORPHINE SULFATE 4 MG/ML IJ SOLN
4.0000 mg | Freq: Once | INTRAMUSCULAR | Status: AC
  Administered 2014-04-19: 4 mg via INTRAVENOUS
  Filled 2014-04-19: qty 1

## 2014-04-19 MED ORDER — ONDANSETRON 4 MG PO TBDP: 4.0000 mg | ORAL_TABLET | Freq: Three times a day (TID) | ORAL | Status: DC | PRN

## 2014-04-19 MED ORDER — NAPROXEN 500 MG PO TABS: 500.0000 mg | ORAL_TABLET | Freq: Two times a day (BID) | ORAL | Status: DC

## 2014-04-19 NOTE — ED Notes (Signed)
She c/o ruq area abd. Pain with recent alternating constipation/diarrhea with occasional sm. Amt. Red blood in her stools.  She also states she has dysuria and had fever yesterday.

## 2014-04-19 NOTE — ED Notes (Signed)
PA at bedside.

## 2014-04-19 NOTE — ED Notes (Signed)
Patient tolerated fluids and crackers without nausea, vomiting.

## 2014-04-19 NOTE — ED Provider Notes (Signed)
CSN: [id number]     Arrival date & time 04/19/14  1647 History   First MD Initiated Contact with Patient 04/19/14 1710     Chief Complaint  Patient presents with  . Abdominal Pain   Christy Little is a 23 y.o. female with a history of STD and UTIs who presents to the emergency department complaining of right upper quadrant and left upper quadrant abdominal pain as well as bilateral back pain since yesterday. Patient also reports subjective fever since yesterday. The patient reports she had some vaginal discharge yesterday but has had none today. Patient is sexually active and does not use protection. Patient reports some nausea but no vomiting. Patient also reports some dysuria since yesterday. The patient took some Tylenol earlier today with some relief. Patient denies vomiting, diarrhea, vaginal bleeding, hematuria, urinary frequency, urinary urgency, rashes, or lesions.  (Consider location/radiation/quality/duration/timing/severity/associated sxs/prior Treatment) HPI  Past Medical History  Diagnosis Date  . Sinus infection   . Constipation   . Gastritis, acute   . Renal disorder 10/2011    kidney stone   No past surgical history on file. Family History  Problem Relation Age of Onset  . Cancer Mother     uterus   History  Substance Use Topics  . Smoking status: Never Smoker   . Smokeless tobacco: Not on file  . Alcohol Use: Yes     Comment: occasional   OB History    No data available     Review of Systems  Constitutional: Positive for fever. Negative for chills.  HENT: Negative for congestion, ear pain, sore throat and trouble swallowing.   Eyes: Negative for pain and visual disturbance.  Respiratory: Negative for cough, shortness of breath and wheezing.   Cardiovascular: Negative for chest pain and palpitations.  Gastrointestinal: Positive for nausea and abdominal pain. Negative for vomiting, diarrhea and blood in stool.  Genitourinary: Positive for dysuria and flank  pain. Negative for urgency, frequency, hematuria, decreased urine volume, vaginal bleeding, vaginal discharge, difficulty urinating, genital sores and pelvic pain.  Musculoskeletal: Negative for back pain and neck pain.  Skin: Negative for rash and wound.  Neurological: Negative for dizziness, weakness, light-headedness and headaches.  All other systems reviewed and are negative.     Allergies  Amoxicillin  Home Medications   Prior to Admission medications   Medication Sig Start Date End Date Taking? Authorizing Provider  acetaminophen (TYLENOL) 500 MG tablet Take 500 mg by mouth every 6 (six) hours as needed for mild pain.   Yes Historical Provider, MD  EPINEPHrine (EPIPEN 2-PAK) 0.3 mg/0.3 mL DEVI Inject 0.3 mg into the muscle once. For allergic reaction   Yes Historical Provider, MD  ibuprofen (ADVIL,MOTRIN) 200 MG tablet Take 400 mg by mouth every 6 (six) hours as needed for mild pain or moderate pain.   Yes Historical Provider, MD  ondansetron (ZOFRAN) 8 MG tablet Take 1 tablet (8 mg total) by mouth every 8 (eight) hours as needed for nausea or vomiting. 07/07/13  Yes Reuben Likesavid C Keller, MD  cephALEXin (KEFLEX) 500 MG capsule Take 1 capsule (500 mg total) by mouth 3 (three) times daily. Patient not taking: Reported on 04/19/2014 07/07/13   Reuben Likesavid C Keller, MD  cetirizine (ZYRTEC) 10 MG tablet Take 1 tablet (10 mg total) by mouth daily. Patient not taking: Reported on 04/19/2014 11/18/13   Linna HoffJames D Kindl, MD  ciprofloxacin (CIPRO) 500 MG tablet Take 1 tablet (500 mg total) by mouth every 12 (twelve) hours. 04/19/14  Einar Gip Tyshun Tuckerman, PA-C  fluconazole (DIFLUCAN) 150 MG tablet Take 1 tablet (150 mg total) by mouth daily. Patient not taking: Reported on 04/19/2014 07/02/13   Elson Areas, PA-C  fluconazole (DIFLUCAN) 150 MG tablet Take 1 tablet (150 mg total) by mouth once. Patient not taking: Reported on 04/19/2014 07/10/13   Reuben Likes, MD  ipratropium (ATROVENT) 0.06 % nasal spray  Place 2 sprays into both nostrils 4 (four) times daily. Patient not taking: Reported on 04/19/2014 11/18/13   Linna Hoff, MD  metroNIDAZOLE (FLAGYL) 500 MG tablet Take 1 tablet (500 mg total) by mouth 3 (three) times daily. Patient not taking: Reported on 04/19/2014 07/07/13   Reuben Likes, MD  naproxen (NAPROSYN) 500 MG tablet Take 1 tablet (500 mg total) by mouth 2 (two) times daily with a meal. 04/19/14   Einar Gip Berthe Oley, PA-C  nitrofurantoin, macrocrystal-monohydrate, (MACROBID) 100 MG capsule Take 1 capsule (100 mg total) by mouth 2 (two) times daily. Patient not taking: Reported on 04/19/2014 07/02/13   Elson Areas, PA-C  ondansetron (ZOFRAN ODT) 4 MG disintegrating tablet Take 1 tablet (4 mg total) by mouth every 8 (eight) hours as needed for nausea or vomiting. 04/19/14   Einar Gip Doninique Lwin, PA-C   BP 117/73 mmHg  Pulse 101  Temp(Src) 99.2 F (37.3 C) (Oral)  Resp 20  SpO2 100%  LMP 04/14/2014 Physical Exam  Constitutional: She appears well-developed and well-nourished. No distress.  HENT:  Head: Normocephalic and atraumatic.  Right Ear: External ear normal.  Left Ear: External ear normal.  Mouth/Throat: Oropharynx is clear and moist. No oropharyngeal exudate.  Eyes: Conjunctivae are normal. Pupils are equal, round, and reactive to light. Right eye exhibits no discharge. Left eye exhibits no discharge.  Neck: Normal range of motion. Neck supple.  Cardiovascular: Normal rate, regular rhythm, normal heart sounds and intact distal pulses.  Exam reveals no gallop and no friction rub.   No murmur heard. Pulmonary/Chest: Effort normal and breath sounds normal. No respiratory distress. She has no wheezes. She has no rales.  Abdominal: Soft. Bowel sounds are normal. She exhibits no distension and no mass. There is tenderness. There is no rebound and no guarding.  Patient's abdomen is soft. Bowel sounds are present. Patient has some mild right upper and left upper abdominal  tenderness. Patient also some mild bilateral CVA tenderness. No right lower quadrant tenderness. Negative Rovsing sign. Negative psoas and obturator sign. No guarding present.  Genitourinary:  Pelvic exam performed by me with female RN chaperone. There are no external lesions or rashes. Patient has a small amount of blood in her vaginal vault. Patient's cervix is closed. There is no cervical motion tenderness. There is no adnexal tenderness or fullness.  Musculoskeletal: She exhibits no edema.  Lymphadenopathy:    She has no cervical adenopathy.  Neurological: She is alert. Coordination normal.  Skin: Skin is warm and dry. No rash noted. She is not diaphoretic. No erythema. No pallor.  Psychiatric: She has a normal mood and affect. Her behavior is normal.  Nursing note and vitals reviewed.   ED Course  Procedures (including critical care time) Labs Review Labs Reviewed  WET PREP, GENITAL - Abnormal; Notable for the following:    Yeast Wet Prep HPF POC FEW (*)    WBC, Wet Prep HPF POC FEW (*)    All other components within normal limits  CBC WITH DIFFERENTIAL - Abnormal; Notable for the following:    WBC 14.2 (*)  Neutrophils Relative % 78 (*)    Neutro Abs 11.0 (*)    Lymphocytes Relative 11 (*)    Monocytes Absolute 1.6 (*)    All other components within normal limits  COMPREHENSIVE METABOLIC PANEL - Abnormal; Notable for the following:    Sodium 136 (*)    Potassium 3.1 (*)    Total Protein 9.0 (*)    Total Bilirubin 1.7 (*)    GFR calc non Af Amer 83 (*)    Anion gap 19 (*)    All other components within normal limits  URINALYSIS, ROUTINE W REFLEX MICROSCOPIC - Abnormal; Notable for the following:    Color, Urine AMBER (*)    APPearance TURBID (*)    Hgb urine dipstick LARGE (*)    Protein, ur 100 (*)    Leukocytes, UA LARGE (*)    All other components within normal limits  URINE MICROSCOPIC-ADD ON - Abnormal; Notable for the following:    Bacteria, UA FEW (*)    All  other components within normal limits  GC/CHLAMYDIA PROBE AMP  URINE CULTURE  LIPASE, BLOOD  HIV ANTIBODY (ROUTINE TESTING)  RPR  POC URINE PREG, ED    Imaging Review No results found.   EKG Interpretation None      Filed Vitals:   04/19/14 1935 04/19/14 1956 04/19/14 2216 04/19/14 2325  BP: 121/88  105/58 117/73  Pulse: 97  79 101  Temp:  100.6 F (38.1 C) 99.4 F (37.4 C) 99.2 F (37.3 C)  TempSrc:  Oral Oral Oral  Resp: 18  17 20   SpO2: 100%  100% 100%     MDM   Meds given in ED:  Medications  sodium chloride 0.9 % bolus 1,000 mL (0 mLs Intravenous Stopped 04/19/14 1933)  HYDROmorphone (DILAUDID) injection 0.5 mg (0.5 mg Intravenous Given 04/19/14 1934)  cefTRIAXone (ROCEPHIN) 1 g in dextrose 5 % 50 mL IVPB (0 g Intravenous Stopped 04/19/14 2025)  morphine 4 MG/ML injection 4 mg (4 mg Intravenous Given 04/19/14 1955)  ondansetron (ZOFRAN) injection 4 mg (4 mg Intravenous Given 04/19/14 1955)  potassium chloride SA (K-DUR,KLOR-CON) CR tablet 40 mEq (40 mEq Oral Given 04/19/14 2023)  acetaminophen (TYLENOL) tablet 1,000 mg (1,000 mg Oral Given 04/19/14 2024)  sodium chloride 0.9 % bolus 1,000 mL (0 mLs Intravenous Stopped 04/19/14 2310)  ondansetron (ZOFRAN) injection 4 mg (4 mg Intravenous Given 04/19/14 2216)    Discharge Medication List as of 04/19/2014 11:03 PM    START taking these medications   Details  ciprofloxacin (CIPRO) 500 MG tablet Take 1 tablet (500 mg total) by mouth every 12 (twelve) hours., Starting 04/19/2014, Until Discontinued, Print    naproxen (NAPROSYN) 500 MG tablet Take 1 tablet (500 mg total) by mouth 2 (two) times daily with a meal., Starting 04/19/2014, Until Discontinued, Print    ondansetron (ZOFRAN ODT) 4 MG disintegrating tablet Take 1 tablet (4 mg total) by mouth every 8 (eight) hours as needed for nausea or vomiting., Starting 04/19/2014, Until Discontinued, Print        Final diagnoses:  Pyelonephritis   Christy Little is  a 23 y.o. female with a history of STD and UTIs who presents to the emergency department complaining of right upper quadrant and left upper quadrant abdominal pain as well as bilateral back pain since yesterday. Patient has some mild bilateral CVA tenderness. Patient has some mild right upper and left upper abdominal tenderness. Patient is nontoxic-appearing. Patient's pelvic exam was marked only firm small amount  of blood. Likely due to her still being on her period. Patient has too numerous to count white blood cells and mucus present in her urinalysis. Patient negative urine pregnancy test. Patient slightly elevated white count at 14.2. Patient's potassium is slightly low at 3.1. We'll treat this patient for pyelonephritis. The patient received oral potassium, fluid bolus as well as Rocephin in the ED. The patient tolerated this well and was tolerating by mouth fluids as well as food prior to discharge. At discharge patient is afebrile and nontoxic appearing. The patient reports feeling much improved. Discharged patient with Cipro 500 mg twice a day, as well as naproxen and Zofran. Advised patient she needs to follow-up with  the women's outpatient clinic in the next 24-48 hours. Advised patient to return to emergency department with new worsening symptoms, persistent fever or new concerns. Patient verbalized understanding and agreement with plan.  This patient was discussed with and evaluated by Dr. Denton Lank who agrees with assessment and plan.     Lawana Chambers, PA-C 04/20/14 0205  Suzi Roots, MD 04/22/14 [phone]

## 2014-04-19 NOTE — Discharge Instructions (Signed)
Pyelonephritis, Adult °Pyelonephritis is a kidney infection. In general, there are 2 main types of pyelonephritis: °· Infections that come on quickly without any warning (acute pyelonephritis). °· Infections that persist for a long period of time (chronic pyelonephritis). °CAUSES  °Two main causes of pyelonephritis are: °· Bacteria traveling from the bladder to the kidney. This is a problem especially in pregnant women. The urine in the bladder can become filled with bacteria from multiple causes, including: °¨ Inflammation of the prostate gland (prostatitis). °¨ Sexual intercourse in females. °¨ Bladder infection (cystitis). °· Bacteria traveling from the bloodstream to the tissue part of the kidney. °Problems that may increase your risk of getting a kidney infection include: °· Diabetes. °· Kidney stones or bladder stones. °· Cancer. °· Catheters placed in the bladder. °· Other abnormalities of the kidney or ureter. °SYMPTOMS  °· Abdominal pain. °· Pain in the side or flank area. °· Fever. °· Chills. °· Upset stomach. °· Blood in the urine (dark urine). °· Frequent urination. °· Strong or persistent urge to urinate. °· Burning or stinging when urinating. °DIAGNOSIS  °Your caregiver may diagnose your kidney infection based on your symptoms. A urine sample may also be taken. °TREATMENT  °In general, treatment depends on how severe the infection is.  °· If the infection is mild and caught early, your caregiver may treat you with oral antibiotics and send you home. °· If the infection is more severe, the bacteria may have gotten into the bloodstream. This will require intravenous (IV) antibiotics and a hospital stay. Symptoms may include: °¨ High fever. °¨ Severe flank pain. °¨ Shaking chills. °· Even after a hospital stay, your caregiver may require you to be on oral antibiotics for a period of time. °· Other treatments may be required depending upon the cause of the infection. °HOME CARE INSTRUCTIONS  °· Take your  antibiotics as directed. Finish them even if you start to feel better. °· Make an appointment to have your urine checked to make sure the infection is gone. °· Drink enough fluids to keep your urine clear or pale yellow. °· Take medicines for the bladder if you have urgency and frequency of urination as directed by your caregiver. °SEEK IMMEDIATE MEDICAL CARE IF:  °· You have a fever or persistent symptoms for more than 2-3 days. °· You have a fever and your symptoms suddenly get worse. °· You are unable to take your antibiotics or fluids. °· You develop shaking chills. °· You experience extreme weakness or fainting. °· There is no improvement after 2 days of treatment. °MAKE SURE YOU: °· Understand these instructions. °· Will watch your condition. °· Will get help right away if you are not doing well or get worse. °Document Released: 04/24/2005 Document Revised: 10/24/2011 Document Reviewed: 09/28/2010 °ExitCare® Patient Information ©2015 ExitCare, LLC. This information is not intended to replace advice given to you by your health care provider. Make sure you discuss any questions you have with your health care provider. ° °Emergency Department Resource Guide °1) Find a Doctor and Pay Out of Pocket °Although you won't have to find out who is covered by your insurance plan, it is a good idea to ask around and get recommendations. You will then need to call the office and see if the doctor you have chosen will accept you as a new patient and what types of options they offer for patients who are self-pay. Some doctors offer discounts or will set up payment plans for their patients who   do not have insurance, but you will need to ask so you aren't surprised when you get to your appointment. ° °2) Contact Your Local Health Department °Not all health departments have doctors that can see patients for sick visits, but many do, so it is worth a call to see if yours does. If you don't know where your local health department  is, you can check in your phone book. The CDC also has a tool to help you locate your state's health department, and many state websites also have listings of all of their local health departments. ° °3) Find a Walk-in Clinic °If your illness is not likely to be very severe or complicated, you may want to try a walk in clinic. These are popping up all over the country in pharmacies, drugstores, and shopping centers. They're usually staffed by nurse practitioners or physician assistants that have been trained to treat common illnesses and complaints. They're usually fairly quick and inexpensive. However, if you have serious medical issues or chronic medical problems, these are probably not your best option. ° °No Primary Care Doctor: °- Call [facility] at  832-8000 - they can help you locate a primary care doctor that  accepts your insurance, provides certain services, etc. °- Physician Referral Service- 1-800-533-3463 ° °Chronic Pain Problems: °Organization         Address  Phone   Notes  °[facility]  (336) 297-2271 Patients need to be referred by their primary care doctor.  ° °Medication Assistance: °Organization         Address  Phone   Notes  °[place] [facility] [address] °[place], [place] [address] (336) 641-8030 --Must be a resident of [place] °-- Must have NO insurance coverage whatsoever (no Medicaid/ Medicare, etc.) °-- The pt. MUST have a primary care doctor that directs their care regularly and follows them in the community °  °[facility]  (866) 331-1348   °[employer]  (888) 892-1162   ° °Agencies that provide inexpensive medical care: °Organization         Address  Phone   Notes  °[facility]  (336) 832-8035   °[facility]    (336) 832-7272   °[facility] [address] °[place], [place] [address] (336) 832-4777   °[facility] [address], °[place]  (336) 271-4999   °[facility]    (336) 373-0678   °[facility]    (336) 272-1050   °[facility] ° [address], [place] Phone:  (336) 832-4444, Fax:  (336) 832-4440 Hours of Operation:  9 am - 6 pm, M-F.  Also accepts Medicaid/Medicare and self-pay.  °[facility] ° [address], [place] Phone: (336) 832-3150, Fax: (336) 832-3151. Hours of Operation:  8:30 am - 5:30 pm, M-F.  Also accepts Medicaid and self-pay.  °[facility] [place] [address], [place] Phone: (336) 878-6027   °[facility] [address], [place], [place] (336)723-1848, Ext. 123 Mondays & Thursdays: 7-9 AM.  First 15 patients are seen on a first come, first serve basis. °  ° °Medicaid-accepting [place] Providers: ° °Organization         Address  Phone   Notes  °[facility] [address],  (336) 641-2100 Also accepts self-pay patients.  °[facility]   [address], [place] ° (336) 856-9996   °[facility] [address], [place] (336) 288-8857   °[facility] [address] [place] [address], [place] (336) 299-7000   °Veita Bland [address], [place]  ° (336) 373-1557 Only accepts [place] Access Medicaid patients after they have their name applied to their card.  ° °Self-Pay (no insurance) in [place]: ° °Organization         Address  Phone   Notes  °Sickle Cell Patients, [facility] [address], [place] (336) 832-1970   °[facility] [address], [place] (336) 832-4400   °[facility] ° [address], [place] (336) 992-4800   °[facility]/Dr. Osei-Bonsu ° [address] [place] [address], [place] or [address], [place] (336) 841-8500 Phone number for both [place] and [place]  locations is the same.  °[facility] [address], [place] (336) 299-0000   °[facility] [address] [place] [address], [place] or [address] (336) 852-7530 °(336) 878-2260   °[facility] [address], [place] (336) 350-1642, phone; (336) 294-5005, fax Sees patients 1st and 3rd Saturday of every month.  Must not qualify for public or private insurance (i.e. Medicaid, Medicare, [place] Health Choice, Veterans' Benefits) • Household income should be no more than 200% of the poverty level •The clinic cannot treat you if you are pregnant or think you are pregnant • Sexually transmitted diseases are not treated at the clinic.  ° ° °Dental Care: °Organization         Address  Phone  Notes  °[facility] [facility] [address], [place] (336) 641-6152 Accepts children up to age 21 who are enrolled in Medicaid or [place] Health Choice; pregnant women with a Medicaid card; and children who have applied for Medicaid or [place] Health Choice, but were declined, whose parents can pay a reduced fee at time of service.  °[facility] [place]  [address], [place] (336) 641-7733 Accepts children up to age 21 who are enrolled in Medicaid or [place] Health Choice; pregnant women with a Medicaid card; and children who have applied for Medicaid or  Health Choice, but were declined, whose parents can pay a reduced fee at time of service.  °[facility] ° [address],  (336) 641-4533 Patients are seen by appointment only. Walk-ins are not accepted. [facility] will see patients 18 years of age and older. °Monday - Tuesday (8am-5pm) °Most Wednesdays (8:30-5pm) °$30 per visit, cash only  °[facility] ° [address], [place] (336) 641-4533 Patients are seen by appointment only. Walk-ins are not accepted. [facility]  will see patients 18 years of age and older. °One Wednesday Evening (Monthly: Volunteer Based).  $30 per visit, cash only  °[facility]  (919) 537-3737 for adults; Children under age 4, call Graduate Pediatric Dentistry at (919) [phone]. Children aged 4-14, please call (919) [phone] to request a pediatric application. ° Dental services are provided in all areas of dental care including fillings, crowns and bridges, complete and partial dentures, implants, gum treatment, root canals, and extractions. Preventive care is also provided. Treatment is provided to both adults and children. °Patients   are selected via a lottery and there is often a waiting list. °  °[facility] [address], °[place] ° (336) 763-8833 [email or web address] °  °[facility] [address], [place], [place] (336)723-1848, Ext. 123 Second and Fourth Thursday of each month, opens at 6:30 AM; Clinic ends at 9 AM.  Patients are seen on a first-come first-served basis, and a limited number are seen during each clinic.  ° °[facility] ° [address], [place], [place] (336) 723-7904   Eligibility Requirements °You must have lived in [place], [place], or [place] counties for at least the last three months. °  You cannot be eligible for state or federal sponsored healthcare insurance, including Veterans Administration, Medicaid, or Medicare. °  You generally cannot be eligible for healthcare insurance through your employer.  °  How to apply: °Eligibility screenings are held every Tuesday and Wednesday afternoon from 1:00 pm until 4:00 pm. You do not need an appointment for the interview!  °[facility] [address], [place], [place] 336-631-2330   °[facility]  336-342-8273   °[facility]  336-703-3100   °[facility]  336-570-6415   ° °Behavioral Health Resources in the Community: °Intensive Outpatient  Programs °Organization         Address  Phone  Notes  °[facility] [address], [place], [place] 336-878-6098   °[facility] [address], [place], [place] 336-832-9800   °[facility] [address], [place], [place] ° 336-882-2125   °[facility] [address],  °[place], [place] 1-800-853-5163 or 336-641-4981   °Substance Abuse Resources °Organization         Address  Phone  Notes  °[facility]  336-882-2125   °[facility]  336-784-9470   °[facility]  336-285-9073   °[facility]  336-845-3988   °Residential & Outpatient Substance Abuse Program  1-800-659-3381   °Psychological Services °Organization         Address  Phone  Notes  °[facility]  336- 832-9600   °[facility]  336- 378-7881   °[facility] [address], [place] 1-800-853-5163 or 336-641-4981   ° °Mobile Crisis Teams °Organization         Address  Phone  Notes  °[facility], Mobile Crisis Care Unit  1-877-626-1772   °Assertive °[facility] ° [address] [place], [place] 336-834-9664   °Sharon DeEsch [address] °[place] 336-554-5454   ° °Self-Help/Support Groups °Organization         Address  Phone             Notes  °[facility] - variety of support groups  336- 373-1402 Call for more information  °Narcotics Anonymous (NA), [facility] [address], °[place]  2 meetings at this location  ° °Residential Treatment Programs °Organization         Address  Phone  Notes  °[facility] [address],    °[place]  1-866-801-8205   °[facility] ° [address], [place], [place] 704-293-8524   °[facility] [address], [place] 336-845-3988 Admissions: 8am-3pm M-F  °[facility] [address]    °[place], [place]  336-841-1104   °[facility] [address], [place], [place] 336-379-7146   °[facility] [address]  °[place], [place] 336-285-9073   °[facility] - Intensive Outpatient [address], [place], [place] 336-852-3033   °[facility] [address]  °[place], [place] 1-877-615-2722 or 336-784-9470   °[facility] [address] [place], [place] 336-227-7417 Accepts Medicaid  °[facility] [address]  °[place] 1-800-659-3381 Substance Abuse/Addiction Treatment  ° °[place] Behavioral Health Resources °Organization         Address  Phone  Notes  °[facility]  (888) 581-9988   °Julie Brannon, PhD [address] [place], [place]   (336) 349-5553 or (336) 951-0000   °[facility]   [address] °[place], [place] (336) 349-4454   °[facility] [address], [place], [place] (336) 342-8316 Insurance/Medicaid/sponsorship through [facility]  °[facility] [address]                                    [place], [place] (336) 342-8316 Therapy/tele-psych/case  °[facility] [address]  ° , [place] (336) 349-2233    °Dr. Arfeen  (336) 349-4544   °[facility] [place]  [employer] [place] [facility] 1) [address],  °2) [address], [place] °3)  [address], [place] (336) 349-3220 °(336) 342-7768 ° °(336) 342-8140   °[place] Child Abuse [facility] (336) 342-1394 or (336) 342-3537 (After Hours)    ° ° °

## 2014-04-20 LAB — GC/CHLAMYDIA PROBE AMP
CT Probe RNA: NEGATIVE
GC Probe RNA: NEGATIVE

## 2014-04-20 LAB — HIV ANTIBODY (ROUTINE TESTING W REFLEX): HIV 1&2 Ab, 4th Generation: NONREACTIVE

## 2014-04-21 LAB — URINE CULTURE: Colony Count: >=100000

## 2014-04-22 ENCOUNTER — Telehealth (HOSPITAL_COMMUNITY): Payer: Self-pay

## 2014-04-22 NOTE — ED Notes (Signed)
Post ED Visit - Positive Culture Follow-up  Culture report reviewed by antimicrobial stewardship pharmacist: [x]  Wes Dulaney, Pharm.D., BCPS []  Celedonio MiyamotoJeremy Frens, Pharm.D., BCPS []  Georgina PillionElizabeth Martin, [address] BCPS []  OdessaMinh Pham, [address]., BCPS, AAHIVP []  Estella HuskMichelle Turner, Pharm.D., BCPS, AAHIVP []  Elder CyphersLorie Poole, [address] BCPS  Positive urine culture Treated with cipro, organism sensitive to the same and no further patient follow-up is required at this time.  Ashley JacobsFesterman, Sai Zinn C 04/22/2014, 10:37 AM

## 2014-05-04 ENCOUNTER — Encounter: Payer: Self-pay | Admitting: Obstetrics & Gynecology

## 2014-06-05 ENCOUNTER — Encounter: Payer: BC Managed Care – PPO | Admitting: Obstetrics & Gynecology

## 2014-06-05 ENCOUNTER — Ambulatory Visit (INDEPENDENT_AMBULATORY_CARE_PROVIDER_SITE_OTHER): Payer: BLUE CROSS/BLUE SHIELD | Admitting: Emergency Medicine

## 2014-06-05 VITALS — BP 124/82 | HR 73 | Temp 98.7°F | Resp 16 | Ht 62.75 in | Wt 117.0 lb

## 2014-06-05 DIAGNOSIS — A499 Bacterial infection, unspecified: Secondary | ICD-10-CM

## 2014-06-05 DIAGNOSIS — B373 Candidiasis of vulva and vagina: Secondary | ICD-10-CM

## 2014-06-05 DIAGNOSIS — N898 Other specified noninflammatory disorders of vagina: Secondary | ICD-10-CM

## 2014-06-05 DIAGNOSIS — B3731 Acute candidiasis of vulva and vagina: Secondary | ICD-10-CM

## 2014-06-05 DIAGNOSIS — R300 Dysuria: Secondary | ICD-10-CM

## 2014-06-05 DIAGNOSIS — N760 Acute vaginitis: Secondary | ICD-10-CM

## 2014-06-05 DIAGNOSIS — B9689 Other specified bacterial agents as the cause of diseases classified elsewhere: Secondary | ICD-10-CM

## 2014-06-05 LAB — POCT WET PREP WITH KOH
KOH Prep POC: POSITIVE
RBC Wet Prep HPF POC: NEGATIVE
Trichomonas, UA: NEGATIVE

## 2014-06-05 LAB — POCT UA - MICROSCOPIC ONLY
Bacteria, U Microscopic: NEGATIVE
Casts, Ur, LPF, POC: NEGATIVE
Crystals, Ur, HPF, POC: NEGATIVE
Mucus, UA: NEGATIVE
RBC, urine, microscopic: NEGATIVE
Yeast, UA: NEGATIVE

## 2014-06-05 LAB — POCT URINALYSIS DIPSTICK
Bilirubin, UA: NEGATIVE
Blood, UA: NEGATIVE
Glucose, UA: NEGATIVE
Leukocytes, UA: NEGATIVE
Nitrite, UA: NEGATIVE
Protein, UA: 30
Spec Grav, UA: 1.025
Urobilinogen, UA: 1
pH, UA: 6

## 2014-06-05 LAB — POCT URINE PREGNANCY: Preg Test, Ur: NEGATIVE

## 2014-06-05 MED ORDER — METRONIDAZOLE 500 MG PO TABS: 500.0000 mg | ORAL_TABLET | Freq: Two times a day (BID) | ORAL | Status: DC

## 2014-06-05 MED ORDER — FLUCONAZOLE 150 MG PO TABS: 150.0000 mg | ORAL_TABLET | Freq: Once | ORAL | Status: DC

## 2014-06-05 NOTE — Progress Notes (Signed)
[facility] [address] [place] [address] [phone]  Date:  06/05/2014   Name:  Christy Little   DOB:  May 15, 1990   MRN:  [id number]  PCP:  No PCP Per Patient    Chief Complaint: Vaginitis; Urinary Tract Infection; Burning with urination; and Flank Pain   History of Present Illness:  Christy Little is a 24 y.o. very pleasant female patient who presents with the following:  Says she has an odiferous vaginal discharge for two week No vaginal bleeding.  Had dyspareunia now not. Has dysuria, urgency and frequency Some right back pain Had STD check in November in ER. No fever or chills.  No improvement with over the counter medications or other home remedies.  Denies other complaint or health concern today.   There are no active problems to display for this patient.   Past Medical History  Diagnosis Date  . Sinus infection   . Constipation   . Gastritis, acute   . Renal disorder 10/2011    kidney stone    History reviewed. No pertinent past surgical history.  History  Substance Use Topics  . Smoking status: Never Smoker   . Smokeless tobacco: Not on file  . Alcohol Use: Yes     Comment: occasional    Family History  Problem Relation Age of Onset  . Cancer Mother     uterus    Allergies  Allergen Reactions  . Amoxicillin Itching and Rash    Medication list has been reviewed and updated.  Current Outpatient Prescriptions on File Prior to Visit  Medication Sig Dispense Refill  . acetaminophen (TYLENOL) 500 MG tablet Take 500 mg by mouth every 6 (six) hours as needed for mild pain.    . cetirizine (ZYRTEC) 10 MG tablet Take 1 tablet (10 mg total) by mouth daily. (Patient not taking: Reported on 04/19/2014) 30 tablet 1  . EPINEPHrine (EPIPEN 2-PAK) 0.3 mg/0.3 mL DEVI Inject 0.3 mg into the muscle once. For allergic reaction    . ibuprofen (ADVIL,MOTRIN) 200 MG tablet Take 400 mg by mouth every 6 (six) hours as needed for mild pain or  moderate pain.     No current facility-administered medications on file prior to visit.    Review of Systems:  As per HPI, otherwise negative.    Physical Examination: Filed Vitals:   06/05/14 1654  BP: 124/82  Pulse: 73  Temp: 98.7 F (37.1 C)  Resp: 16   Filed Vitals:   06/05/14 1654  Height: 5' 2.75" (1.594 m)  Weight: 117 lb (53.071 kg)   Body mass index is 20.89 kg/(m^2). Ideal Body Weight: Weight in (lb) to have BMI = 25: 139.7  GEN: WDWN, NAD, Non-toxic, A & O x 3 HEENT: Atraumatic, Normocephalic. Neck supple. No masses, No LAD. Ears and Nose: No external deformity. CV: RRR, No M/G/R. No JVD. No thrill. No extra heart sounds. PULM: CTA B, no wheezes, crackles, rhonchi. No retractions. No resp. distress. No accessory muscle use. ABD: S, NT, ND, +BS. No rebound. No HSM. EXTR: No c/c/e NEURO Normal gait.  PSYCH: Normally interactive. Conversant. Not depressed or anxious appearing.  Calm demeanor.    Assessment and Plan: BV Candidiasis Flagyl Diflucan  Signed,  Phillips Odor, MD   Results for orders placed or performed in visit on 06/05/14  POCT UA - Microscopic Only  Result Value Ref Range   WBC, Ur, HPF, POC 0-2    RBC, urine, microscopic neg  Bacteria, U Microscopic neg    Mucus, UA neg    Epithelial cells, urine per micros 0-1    Crystals, Ur, HPF, POC neg    Casts, Ur, LPF, POC neg    Yeast, UA neg   POCT urinalysis dipstick  Result Value Ref Range   Color, UA yellow    Clarity, UA clear    Glucose, UA neg    Bilirubin, UA neg    Ketones, UA trace    Spec Grav, UA 1.025    Blood, UA neg    pH, UA 6.0    Protein, UA 30    Urobilinogen, UA 1.0    Nitrite, UA neg    Leukocytes, UA Negative   POCT Wet Prep with KOH  Result Value Ref Range   Trichomonas, UA Negative    Clue Cells Wet Prep HPF POC 2-5    Epithelial Wet Prep HPF POC 10-15    Yeast Wet Prep HPF POC present    Bacteria Wet Prep HPF POC trace    RBC Wet Prep HPF POC  neg    WBC Wet Prep HPF POC 0-4    KOH Prep POC Positive

## 2014-06-05 NOTE — Patient Instructions (Signed)
Bacterial Vaginosis Bacterial vaginosis is a vaginal infection that occurs when the normal balance of bacteria in the vagina is disrupted. It results from an overgrowth of certain bacteria. This is the most common vaginal infection in women of childbearing age. Treatment is important to prevent complications, especially in pregnant women, as it can cause a premature delivery. CAUSES  Bacterial vaginosis is caused by an increase in harmful bacteria that are normally present in smaller amounts in the vagina. Several different kinds of bacteria can cause bacterial vaginosis. However, the reason that the condition develops is not fully understood. RISK FACTORS Certain activities or behaviors can put you at an increased risk of developing bacterial vaginosis, including:  Having a new sex partner or multiple sex partners.  Douching.  Using an intrauterine device (IUD) for contraception. Women do not get bacterial vaginosis from toilet seats, bedding, swimming pools, or contact with objects around them. SIGNS AND SYMPTOMS  Some women with bacterial vaginosis have no signs or symptoms. Common symptoms include:  Grey vaginal discharge.  A fishlike odor with discharge, especially after sexual intercourse.  Itching or burning of the vagina and vulva.  Burning or pain with urination. DIAGNOSIS  Your health care provider will take a medical history and examine the vagina for signs of bacterial vaginosis. A sample of vaginal fluid may be taken. Your health care provider will look at this sample under a microscope to check for bacteria and abnormal cells. A vaginal pH test may also be done.  TREATMENT  Bacterial vaginosis may be treated with antibiotic medicines. These may be given in the form of a pill or a vaginal cream. A second round of antibiotics may be prescribed if the condition comes back after treatment.  HOME CARE INSTRUCTIONS   Only take over-the-counter or prescription medicines as  directed by your health care provider.  If antibiotic medicine was prescribed, take it as directed. Make sure you finish it even if you start to feel better.  Do not have sex until treatment is completed.  Tell all sexual partners that you have a vaginal infection. They should see their health care provider and be treated if they have problems, such as a mild rash or itching.  Practice safe sex by using condoms and only having one sex partner. SEEK MEDICAL CARE IF:   Your symptoms are not improving after 3 days of treatment.  You have increased discharge or pain.  You have a fever. MAKE SURE YOU:   Understand these instructions.  Will watch your condition.  Will get help right away if you are not doing well or get worse. FOR MORE INFORMATION  Centers for Disease Control and Prevention, Division of STD Prevention: www.cdc.gov/std American Sexual Health Association (ASHA): www.ashastd.org  Document Released: 04/24/2005 Document Revised: 02/12/2013 Document Reviewed: 12/04/2012 ExitCare Patient Information 2015 [employer]. This information is not intended to replace advice given to you by your health care provider. Make sure you discuss any questions you have with your health care provider.  

## 2014-09-25 ENCOUNTER — Ambulatory Visit (INDEPENDENT_AMBULATORY_CARE_PROVIDER_SITE_OTHER): Payer: BLUE CROSS/BLUE SHIELD | Admitting: Family Medicine

## 2014-09-25 VITALS — BP 112/78 | HR 77 | Temp 98.6°F | Resp 16 | Ht 63.5 in | Wt 119.0 lb

## 2014-09-25 DIAGNOSIS — Z0000 Encounter for general adult medical examination without abnormal findings: Secondary | ICD-10-CM | POA: Diagnosis not present

## 2014-09-25 DIAGNOSIS — B9689 Other specified bacterial agents as the cause of diseases classified elsewhere: Secondary | ICD-10-CM

## 2014-09-25 DIAGNOSIS — K589 Irritable bowel syndrome without diarrhea: Secondary | ICD-10-CM

## 2014-09-25 DIAGNOSIS — K5900 Constipation, unspecified: Secondary | ICD-10-CM

## 2014-09-25 DIAGNOSIS — A499 Bacterial infection, unspecified: Secondary | ICD-10-CM

## 2014-09-25 DIAGNOSIS — N760 Acute vaginitis: Secondary | ICD-10-CM | POA: Diagnosis not present

## 2014-09-25 LAB — POCT CBC
Granulocyte percent: 57.9 %G (ref 37–80)
HCT, POC: 45.2 % (ref 37.7–47.9)
Hemoglobin: 14 g/dL (ref 12.2–16.2)
Lymph, poc: 1.8 (ref 0.6–3.4)
MCH, POC: 26.1 pg — AB (ref 27–31.2)
MCHC: 30.9 g/dL — AB (ref 31.8–35.4)
MCV: 84.5 fL (ref 80–97)
MID (cbc): 0.4 (ref 0–0.9)
MPV: 7 fL (ref 0–99.8)
POC Granulocyte: 3 (ref 2–6.9)
POC LYMPH PERCENT: 34.9 %L (ref 10–50)
POC MID %: 7.2 %M (ref 0–12)
Platelet Count, POC: 297 10*3/uL (ref 142–424)
RBC: 5.35 M/uL (ref 4.04–5.48)
RDW, POC: 15.9 %
WBC: 5.2 10*3/uL (ref 4.6–10.2)

## 2014-09-25 LAB — POCT URINALYSIS DIPSTICK
Bilirubin, UA: NEGATIVE
Blood, UA: NEGATIVE
Glucose, UA: NEGATIVE
Ketones, UA: NEGATIVE
Leukocytes, UA: NEGATIVE
Nitrite, UA: NEGATIVE
Protein, UA: NEGATIVE
Spec Grav, UA: 1.02
Urobilinogen, UA: 0.2
pH, UA: 8

## 2014-09-25 LAB — COMPLETE METABOLIC PANEL WITH GFR
ALT: <8 U/L (ref 0–35)
AST: 14 U/L (ref 0–37)
Albumin: 4.4 g/dL (ref 3.5–5.2)
Alkaline Phosphatase: 70 U/L (ref 39–117)
BUN: 6 mg/dL (ref 6–23)
CO2: 26 mEq/L (ref 19–32)
Calcium: 9.6 mg/dL (ref 8.4–10.5)
Chloride: 102 mEq/L (ref 96–112)
Creat: 0.77 mg/dL (ref 0.50–1.10)
GFR, Est African American: >89 mL/min
GFR, Est Non African American: >89 mL/min
Glucose, Bld: 84 mg/dL (ref 70–99)
Potassium: 4.1 mEq/L (ref 3.5–5.3)
Sodium: 137 mEq/L (ref 135–145)
Total Bilirubin: 1.9 mg/dL — ABNORMAL HIGH (ref 0.2–1.2)
Total Protein: 7.9 g/dL (ref 6.0–8.3)

## 2014-09-25 LAB — POCT UA - MICROSCOPIC ONLY
Casts, Ur, LPF, POC: NEGATIVE
Crystals, Ur, HPF, POC: NEGATIVE
Mucus, UA: POSITIVE
Yeast, UA: NEGATIVE

## 2014-09-25 LAB — POCT WET PREP WITH KOH
KOH Prep POC: NEGATIVE
Trichomonas, UA: NEGATIVE
Yeast Wet Prep HPF POC: NEGATIVE

## 2014-09-25 LAB — LIPID PANEL
Cholesterol: 134 mg/dL (ref 0–200)
HDL: 53 mg/dL (ref >=46)
LDL Cholesterol: 69 mg/dL (ref 0–99)
Total CHOL/HDL Ratio: 2.5 Ratio
Triglycerides: 60 mg/dL (ref <150)
VLDL: 12 mg/dL (ref 0–40)

## 2014-09-25 LAB — TSH: TSH: 0.434 u[IU]/mL (ref 0.350–4.500)

## 2014-09-25 LAB — POCT URINE PREGNANCY: Preg Test, Ur: NEGATIVE

## 2014-09-25 MED ORDER — METRONIDAZOLE 500 MG PO TABS: 500.0000 mg | ORAL_TABLET | Freq: Two times a day (BID) | ORAL | Status: DC

## 2014-09-25 NOTE — Progress Notes (Signed)
Patient ID: Christy Little MRN: [id number], DOB: 06/02/1990, 24 y.o. Date of Encounter: 09/25/2014, 2:50 PM  Primary Physician: No PCP Per Patient  Chief Complaint: Physical (CPE)  HPI: 24 y.o. y/o female with history of noted below here for CPE.  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  New partner recently. She has some urgency and frequency.  She went to an Urgent Care two weeks ago, no dx made.  LMP: May 10th  Last Td: Review of Systems: Consitutional: No fever, chills, fatigue, night sweats, lymphadenopathy, or weight changes. Eyes: No visual changes, eye redness, or discharge. ENT/Mouth: Ears: No otalgia, tinnitus, hearing loss, discharge. Nose: No congestion, rhinorrhea, sinus pain, or epistaxis. Throat: No sore throat, post nasal drip, or teeth pain. Cardiovascular: No CP, palpitations, diaphoresis, DOE, edema, orthopnea, PND. Respiratory: No cough, hemoptysis, SOB, or wheezing. Gastrointestinal: No anorexia, dysphagia, reflux, pain, nausea, vomiting, hematemesis, diarrhea, constipation, BRBPR, or melena. Breast: No discharge, pain, swelling, or mass. Genitourinary: No dysuria, frequency, urgency, hematuria, incontinence, nocturia, amenorrhea, vaginal discharge, pruritis, burning, abnormal bleeding, or pain. Musculoskeletal: No decreased ROM, myalgias, stiffness, joint swelling, or weakness. Skin: No rash, erythema, lesion changes, pain, warmth, jaundice, or pruritis. Neurological: No headache, dizziness, syncope, seizures, tremors, memory loss, coordination problems, or paresthesias. Psychological: No anxiety, depression, hallucinations, SI/HI. Endocrine: No fatigue, polydipsia, polyphagia, polyuria, or known diabetes. All other systems were reviewed and are otherwise negative.  Past Medical History    Diagnosis Date  . Sinus infection   . Constipation   . Gastritis, acute   . Renal disorder 10/2011    kidney stone     History reviewed. No pertinent past surgical history.  Home Meds:  Prior to Admission medications   Medication Sig Start Date End Date Taking? Authorizing Provider  ibuprofen (ADVIL,MOTRIN) 200 MG tablet Take 400 mg by mouth every 6 (six) hours as needed for mild pain or moderate pain.   Yes Historical Provider, MD  EPINEPHrine (EPIPEN 2-PAK) 0.3 mg/0.3 mL DEVI Inject 0.3 mg into the muscle once. For allergic reaction    Historical Provider, MD    Allergies:  Allergies  Allergen Reactions  . Amoxicillin Itching and Rash    History   Social History  . Marital Status: Single    Spouse Name: N/A  . Number of Children: N/A  . Years of Education: N/A   Occupational History  . Not on file.   Social History Main Topics  . Smoking status: Never Smoker   . Smokeless tobacco: Not on file  . Alcohol Use: Yes     Comment: occasional  . Drug Use: 1.00 per week    Special: Marijuana  . Sexual Activity: Yes    Birth Control/ Protection: None   Other Topics Concern  . Not on file   Social History Narrative    Family History  Problem Relation Age of Onset  . Cancer Mother     uterus    Physical Exam: Blood pressure 112/78, pulse 77, temperature 98.6 F (37 C), resp. rate 16, height 5' 3.5" (1.613 m), weight 119 lb (53.978 kg), last menstrual period 09/15/2014, SpO2 98 %., Body mass index is 20.75 kg/(m^2). Wt Readings from Last 3 Encounters:  09/25/14 119 lb (53.978 kg)  06/05/14 117 lb (53.071 kg)   BP Readings from Last 3 Encounters:  09/25/14 112/78  06/05/14 124/82  04/19/14 117/73   General: Well developed, well nourished, in no acute distress. HEENT: Normocephalic, atraumatic. Conjunctiva pink, sclera non-icteric. Pupils 2 mm constricting to 1 mm, round, regular, and equally reactive to light and accomodation. EOMI. Fundi benign   Internal  auditory canal clear. TMs with good cone of light and without pathology. Nasal mucosa pink. Nares are without discharge. No sinus tenderness. Oral mucosa pink. Dentition good. Pharynx without exudate.    Neck: Supple. Trachea midline. No thyromegaly. Full ROM. No lymphadenopathy. Lungs: Clear to auscultation bilaterally without wheezes, rales, or rhonchi. Breathing is of normal effort and unlabored. Cardiovascular: RRR with S1 S2. No murmurs, rubs, or gallops appreciated. Distal pulses 2+ symmetrically. No carotid or abdominal bruits.  Abdomen: Soft, non-tender, non-distended with normoactive bowel sounds. No hepatosplenomegaly or masses. No rebound/guarding. No CVA tenderness. Without hernias.  Genitourinary:  External genitalia without lesions. Vaginal mucosa pink. Cervix pink and without discharge. No cervical or adnexal tenderness. Pap smear taken Musculoskeletal: Full range of motion and 5/5 strength throughout. Without swelling, atrophy, tenderness, crepitus, or warmth. Extremities without clubbing, cyanosis, or edema. Calves supple. Skin: Warm and moist without erythema, ecchymosis, wounds, or rash. Neuro: A+Ox3. CN II-XII grossly intact. Moves all extremities spontaneously. Full sensation throughout. Normal gait. DTR 2+ throughout upper and lower extremities. Finger to nose intact. Psych:  Responds to questions appropriately with a normal affect.   Minimal vaginal white streaky discharge.  Results for orders placed or performed in visit on 09/25/14  POCT CBC  Result Value Ref Range   WBC 5.2 4.6 - 10.2 K/uL   Lymph, poc 1.8 0.6 - 3.4   POC LYMPH PERCENT 34.9 10 - 50 %L   MID (cbc) 0.4 0 - 0.9   POC MID % 7.2 0 - 12 %M   POC Granulocyte 3.0 2 - 6.9   Granulocyte percent 57.9 37 - 80 %G   RBC 5.35 4.04 - 5.48 M/uL   Hemoglobin 14.0 12.2 - 16.2 g/dL   HCT, POC 40.9 81.1 - 47.9 %   MCV 84.5 80 - 97 fL   MCH, POC 26.1 (A) 27 - 31.2 pg   MCHC 30.9 (A) 31.8 - 35.4 g/dL   RDW, POC 91.4  %   Platelet Count, POC 297 142 - 424 K/uL   MPV 7.0 0 - 99.8 fL  POCT urinalysis dipstick  Result Value Ref Range   Color, UA yellow    Clarity, UA clear    Glucose, UA neg    Bilirubin, UA neg    Ketones, UA neg    Spec Grav, UA 1.020    Blood, UA neg    pH, UA 8.0    Protein, UA neg    Urobilinogen, UA 0.2    Nitrite, UA neg    Leukocytes, UA Negative   POCT UA - Microscopic Only  Result Value Ref Range   WBC, Ur, HPF, POC 0-4    RBC, urine, microscopic 0-2    Bacteria, U Microscopic few    Mucus, UA positive    Epithelial cells, urine per micros 2-4    Crystals, Ur, HPF, POC neg    Casts, Ur, LPF, POC neg    Yeast, UA neg    Renal tubular cells 0-3   POCT urine pregnancy  Result Value Ref Range   Preg Test, Ur Negative   POCT Wet Prep with KOH  Result Value Ref Range   Trichomonas, UA Negative    Clue Cells Wet Prep HPF POC  10-12    Epithelial Wet Prep HPF POC 1-2    Yeast Wet Prep HPF POC neg    Bacteria Wet Prep HPF POC large    RBC Wet Prep HPF POC 4-8    WBC Wet Prep HPF POC 0-2    KOH Prep POC Negative      Assessment/Plan:  24 y.o. y/o female here for CPE This chart was scribed in my presence and reviewed by me personally.    ICD-9-CM ICD-10-CM   1. Annual physical exam V70.0 Z00.00 POCT CBC     COMPLETE METABOLIC PANEL WITH GFR     Lipid panel     POCT urinalysis dipstick     POCT UA - Microscopic Only     POCT urine pregnancy     POCT Wet Prep with KOH     Pap IG, CT/NG w/ reflex HPV when ASC-U     RPR     HIV antibody     GC probe amplification, urine  2. IBS (irritable bowel syndrome) 564.1 K58.9   3. Constipation, unspecified constipation type 564.00 K59.00 TSH     Signed, Elvina SidleKurt Serria Sloma, MD  -  Signed, Elvina SidleKurt Kahdijah Errickson, MD 09/25/2014 2:50 PM

## 2014-09-25 NOTE — Patient Instructions (Addendum)
The usual medicine for chronic constipation is Miralax.   Constipation Constipation is when a person has fewer than three bowel movements a week, has difficulty having a bowel movement, or has stools that are dry, hard, or larger than normal. As people grow older, constipation is more common. If you try to fix constipation with medicines that make you have a bowel movement (laxatives), the problem may get worse. Long-term laxative use may cause the muscles of the colon to become weak. A low-fiber diet, not taking in enough fluids, and taking certain medicines may make constipation worse.  CAUSES   Certain medicines, such as antidepressants, pain medicine, iron supplements, antacids, and water pills.   Certain diseases, such as diabetes, irritable bowel syndrome (IBS), thyroid disease, or depression.   Not drinking enough water.   Not eating enough fiber-rich foods.   Stress or travel.   Lack of physical activity or exercise.   Ignoring the urge to have a bowel movement.   Using laxatives too much.  SIGNS AND SYMPTOMS   Having fewer than three bowel movements a week.   Straining to have a bowel movement.   Having stools that are hard, dry, or larger than normal.   Feeling full or bloated.   Pain in the lower abdomen.   Not feeling relief after having a bowel movement.  DIAGNOSIS  Your health care provider will take a medical history and perform a physical exam. Further testing may be done for severe constipation. Some tests may include:  A barium enema X-ray to examine your rectum, colon, and, sometimes, your small intestine.   A sigmoidoscopy to examine your lower colon.   A colonoscopy to examine your entire colon. TREATMENT  Treatment will depend on the severity of your constipation and what is causing it. Some dietary treatments include drinking more fluids and eating more fiber-rich foods. Lifestyle treatments may include regular exercise. If these diet  and lifestyle recommendations do not help, your health care provider may recommend taking over-the-counter laxative medicines to help you have bowel movements. Prescription medicines may be prescribed if over-the-counter medicines do not work.  HOME CARE INSTRUCTIONS   Eat foods that have a lot of fiber, such as fruits, vegetables, whole grains, and beans.  Limit foods high in fat and processed sugars, such as french fries, hamburgers, cookies, candies, and soda.   A fiber supplement may be added to your diet if you cannot get enough fiber from foods.   Drink enough fluids to keep your urine clear or pale yellow.   Exercise regularly or as directed by your health care provider.   Go to the restroom when you have the urge to go. Do not hold it.   Only take over-the-counter or prescription medicines as directed by your health care provider. Do not take other medicines for constipation without talking to your health care provider first.  SEEK IMMEDIATE MEDICAL CARE IF:   You have bright red blood in your stool.   Your constipation lasts for more than 4 days or gets worse.   You have abdominal or rectal pain.   You have thin, pencil-like stools.   You have unexplained weight loss. MAKE SURE YOU:   Understand these instructions.  Will watch your condition.  Will get help right away if you are not doing well or get worse. Document Released: 01/21/2004 Document Revised: 04/29/2013 Document Reviewed: 02/03/2013 [facility] Patient Information 2015 [place], [place]. This information is not intended to replace advice given to you by  your health care provider. Make sure you discuss any questions you have with your health care provider. Bacterial Vaginosis Bacterial vaginosis is a vaginal infection that occurs when the normal balance of bacteria in the vagina is disrupted. It results from an overgrowth of certain bacteria. This is the most common vaginal infection in women of  childbearing age. Treatment is important to prevent complications, especially in pregnant women, as it can cause a premature delivery. CAUSES  Bacterial vaginosis is caused by an increase in harmful bacteria that are normally present in smaller amounts in the vagina. Several different kinds of bacteria can cause bacterial vaginosis. However, the reason that the condition develops is not fully understood. RISK FACTORS Certain activities or behaviors can put you at an increased risk of developing bacterial vaginosis, including:  Having a new sex partner or multiple sex partners.  Douching.  Using an intrauterine device (IUD) for contraception. Women do not get bacterial vaginosis from toilet seats, bedding, swimming pools, or contact with objects around them. SIGNS AND SYMPTOMS  Some women with bacterial vaginosis have no signs or symptoms. Common symptoms include:  Grey vaginal discharge.  A fishlike odor with discharge, especially after sexual intercourse.  Itching or burning of the vagina and vulva.  Burning or pain with urination. DIAGNOSIS  Your health care provider will take a medical history and examine the vagina for signs of bacterial vaginosis. A sample of vaginal fluid may be taken. Your health care provider will look at this sample under a microscope to check for bacteria and abnormal cells. A vaginal pH test may also be done.  TREATMENT  Bacterial vaginosis may be treated with antibiotic medicines. These may be given in the form of a pill or a vaginal cream. A second round of antibiotics may be prescribed if the condition comes back after treatment.  HOME CARE INSTRUCTIONS   Only take over-the-counter or prescription medicines as directed by your health care provider.  If antibiotic medicine was prescribed, take it as directed. Make sure you finish it even if you start to feel better.  Do not have sex until treatment is completed.  Tell all sexual partners that you have a  vaginal infection. They should see their health care provider and be treated if they have problems, such as a mild rash or itching.  Practice safe sex by using condoms and only having one sex partner. SEEK MEDICAL CARE IF:   Your symptoms are not improving after 3 days of treatment.  You have increased discharge or pain.  You have a fever. MAKE SURE YOU:   Understand these instructions.  Will watch your condition.  Will get help right away if you are not doing well or get worse. FOR MORE INFORMATION  Centers for Disease Control and Prevention, Division of STD Prevention: SolutionApps.co.zawww.cdc.gov/std American Sexual Health Association (ASHA): www.ashastd.org  Document Released: 04/24/2005 Document Revised: 02/12/2013 Document Reviewed: 12/04/2012 [facility] Patient Information 2015 [place], [place]. This information is not intended to replace advice given to you by your health care provider. Make sure you discuss any questions you have with your health care provider.

## 2014-09-26 ENCOUNTER — Encounter: Payer: Self-pay | Admitting: Family Medicine

## 2014-09-26 LAB — GC/CHLAMYDIA PROBE AMP
CT Probe RNA: NEGATIVE
GC Probe RNA: NEGATIVE

## 2014-09-26 LAB — RPR

## 2014-09-26 LAB — HIV ANTIBODY (ROUTINE TESTING W REFLEX): HIV 1&2 Ab, 4th Generation: NONREACTIVE

## 2014-09-29 ENCOUNTER — Other Ambulatory Visit: Payer: Self-pay | Admitting: Family Medicine

## 2014-09-29 LAB — PAP IG, CT-NG, RFX HPV ASCU
Chlamydia Probe Amp: NEGATIVE
GC Probe Amp: NEGATIVE

## 2015-02-14 ENCOUNTER — Emergency Department (HOSPITAL_COMMUNITY): Payer: Self-pay

## 2015-02-14 ENCOUNTER — Encounter (HOSPITAL_COMMUNITY): Payer: Self-pay | Admitting: *Deleted

## 2015-02-14 ENCOUNTER — Emergency Department (HOSPITAL_COMMUNITY)
Admission: EM | Admit: 2015-02-14 | Discharge: 2015-02-15 | Disposition: A | Discharge status: Home / Self Care | Payer: Self-pay | Attending: Emergency Medicine | Admitting: Emergency Medicine

## 2015-02-14 DIAGNOSIS — R1011 Right upper quadrant pain: Secondary | ICD-10-CM | POA: Insufficient documentation

## 2015-02-14 DIAGNOSIS — Z3202 Encounter for pregnancy test, result negative: Secondary | ICD-10-CM | POA: Insufficient documentation

## 2015-02-14 DIAGNOSIS — R112 Nausea with vomiting, unspecified: Secondary | ICD-10-CM | POA: Insufficient documentation

## 2015-02-14 DIAGNOSIS — Z880 Allergy status to penicillin: Secondary | ICD-10-CM | POA: Insufficient documentation

## 2015-02-14 LAB — CBC
HCT: 44.2 % (ref 36.0–46.0)
Hemoglobin: 14.6 g/dL (ref 12.0–15.0)
MCH: 27.5 pg (ref 26.0–34.0)
MCHC: 33 g/dL (ref 30.0–36.0)
MCV: 83.2 fL (ref 78.0–100.0)
Platelets: 216 10*3/uL (ref 150–400)
RBC: 5.31 MIL/uL — ABNORMAL HIGH (ref 3.87–5.11)
RDW: 13.7 % (ref 11.5–15.5)
WBC: 7.7 10*3/uL (ref 4.0–10.5)

## 2015-02-14 LAB — COMPREHENSIVE METABOLIC PANEL
ALT: 11 U/L — ABNORMAL LOW (ref 14–54)
AST: 21 U/L (ref 15–41)
Albumin: 4.3 g/dL (ref 3.5–5.0)
Alkaline Phosphatase: 60 U/L (ref 38–126)
Anion gap: 10 (ref 5–15)
BUN: <5 mg/dL — ABNORMAL LOW (ref 6–20)
CO2: 23 mmol/L (ref 22–32)
Calcium: 9.4 mg/dL (ref 8.9–10.3)
Chloride: 102 mmol/L (ref 101–111)
Creatinine, Ser: 0.85 mg/dL (ref 0.44–1.00)
GFR calc Af Amer: >60 mL/min (ref >60)
GFR calc non Af Amer: >60 mL/min (ref >60)
Glucose, Bld: 89 mg/dL (ref 65–99)
Potassium: 3.6 mmol/L (ref 3.5–5.1)
Sodium: 135 mmol/L (ref 135–145)
Total Bilirubin: 1.3 mg/dL — ABNORMAL HIGH (ref 0.3–1.2)
Total Protein: 8 g/dL (ref 6.5–8.1)

## 2015-02-14 LAB — URINALYSIS, ROUTINE W REFLEX MICROSCOPIC
Bilirubin Urine: NEGATIVE
Glucose, UA: NEGATIVE mg/dL
Ketones, ur: 15 mg/dL — AB
Leukocytes, UA: NEGATIVE
Nitrite: NEGATIVE
Protein, ur: NEGATIVE mg/dL
Specific Gravity, Urine: 1.025 (ref 1.005–1.030)
Urobilinogen, UA: 0.2 mg/dL (ref 0.0–1.0)
pH: 6 (ref 5.0–8.0)

## 2015-02-14 LAB — LIPASE, BLOOD: Lipase: 16 U/L — ABNORMAL LOW (ref 22–51)

## 2015-02-14 LAB — POC URINE PREG, ED: Preg Test, Ur: NEGATIVE

## 2015-02-14 MED ORDER — ONDANSETRON HCL 4 MG/2ML IJ SOLN
4.0000 mg | Freq: Once | INTRAMUSCULAR | Status: AC
  Administered 2015-02-14: 4 mg via INTRAVENOUS
  Filled 2015-02-14: qty 2

## 2015-02-14 MED ORDER — MORPHINE SULFATE (PF) 4 MG/ML IV SOLN
4.0000 mg | Freq: Once | INTRAVENOUS | Status: AC
  Administered 2015-02-14: 4 mg via INTRAVENOUS
  Filled 2015-02-14: qty 1

## 2015-02-14 MED ORDER — PROMETHAZINE HCL 25 MG/ML IJ SOLN
25.0000 mg | Freq: Once | INTRAMUSCULAR | Status: AC
  Administered 2015-02-15: 25 mg via INTRAVENOUS
  Filled 2015-02-14: qty 1

## 2015-02-14 MED ORDER — KETOROLAC TROMETHAMINE 30 MG/ML IJ SOLN
30.0000 mg | Freq: Once | INTRAMUSCULAR | Status: AC
  Administered 2015-02-14: 30 mg via INTRAVENOUS
  Filled 2015-02-14: qty 1

## 2015-02-14 MED ORDER — OXYCODONE-ACETAMINOPHEN 5-325 MG PO TABS
1.0000 | ORAL_TABLET | Freq: Once | ORAL | Status: AC
Start: 2015-02-14 — End: 2015-02-14
  Administered 2015-02-14: 1 via ORAL
  Filled 2015-02-14: qty 1

## 2015-02-14 MED ORDER — OXYCODONE-ACETAMINOPHEN 5-325 MG PO TABS: 2.0000 | ORAL_TABLET | Freq: Once | ORAL | Status: DC

## 2015-02-14 MED ORDER — ONDANSETRON 4 MG PO TBDP
8.0000 mg | ORAL_TABLET | Freq: Once | ORAL | Status: AC
  Administered 2015-02-14: 8 mg via ORAL
  Filled 2015-02-14: qty 2

## 2015-02-14 MED ORDER — SODIUM CHLORIDE 0.9 % IV BOLUS (SEPSIS)
1000.0000 mL | Freq: Once | INTRAVENOUS | Status: AC
  Administered 2015-02-14: 1000 mL via INTRAVENOUS

## 2015-02-14 NOTE — ED Notes (Signed)
Th pt is c/o pain in her abd for 2 months crying in triage.   Intermittent nv and d  lmp  now

## 2015-02-14 NOTE — ED Notes (Signed)
Pt also complaining of right shoulder pain.

## 2015-02-14 NOTE — ED Provider Notes (Signed)
CSN: [id number]     Arrival date & time 02/14/15  1750 History   First MD Initiated Contact with Patient 02/14/15 1954     Chief Complaint  Patient presents with  . Abdominal Pain     (Consider location/radiation/quality/duration/timing/severity/associated sxs/prior Treatment) HPI Comments: Patient is a 24 year old female with no past medical history who presents with abdominal pain that started 2 months ago and acutely worsened today. The pain is located in the RUQ and does not radiate. The pain is described as aching and severe. The pain started gradually and progressively worsened since the onset. Fast food and "other fatty foods" make the pain worse. No alleviating factors. The patient has tried nothing for symptoms without relief. Associated symptoms include intermittent nausea and vomiting. Patient denies fever, headache, diarrhea, chest pain, SOB, dysuria, constipation, abnormal vaginal bleeding/discharge. No history of abdominal surgery.      Past Medical History  Diagnosis Date  . Sinus infection   . Constipation   . Gastritis, acute   . Renal disorder 10/2011    kidney stone   History reviewed. No pertinent past surgical history. Family History  Problem Relation Age of Onset  . Cancer Mother     uterus   Social History  Substance Use Topics  . Smoking status: Never Smoker   . Smokeless tobacco: None  . Alcohol Use: Yes     Comment: occasional   OB History    No data available     Review of Systems  Gastrointestinal: Positive for nausea, vomiting and abdominal pain.  All other systems reviewed and are negative.     Allergies  Amoxicillin  Home Medications   Prior to Admission medications   Medication Sig Start Date End Date Taking? Authorizing Provider  EPINEPHrine (EPIPEN 2-PAK) 0.3 mg/0.3 mL DEVI Inject 0.3 mg into the muscle once. For allergic reaction    Historical Provider, MD  ibuprofen (ADVIL,MOTRIN) 200 MG tablet Take 400 mg by mouth every 6  (six) hours as needed for mild pain or moderate pain.    Historical Provider, MD  metroNIDAZOLE (FLAGYL) 500 MG tablet Take 1 tablet (500 mg total) by mouth 2 (two) times daily with a meal. DO NOT CONSUME ALCOHOL WHILE TAKING THIS MEDICATION. 09/25/14   Elvina Sidle, MD   BP 126/74 mmHg  Pulse 110  Temp(Src) 98.9 F (37.2 C) (Oral)  Resp 20  Ht  (1.575 m)  Wt 118 lb 1.6 oz (53.57 kg)  BMI 21.60 kg/m2  SpO2 100% Physical Exam  Constitutional: She is oriented to person, place, and time. She appears well-developed and well-nourished. No distress.  HENT:  Head: Normocephalic and atraumatic.  Eyes: Conjunctivae and EOM are normal.  Neck: Normal range of motion.  Cardiovascular: Normal rate and regular rhythm.  Exam reveals no gallop and no friction rub.   No murmur heard. Pulmonary/Chest: Effort normal and breath sounds normal. She has no wheezes. She has no rales. She exhibits no tenderness.  Abdominal: Soft. She exhibits no distension. There is tenderness. There is no rebound.  RUQ tenderness to palpation. No other focal tenderness to palpation.   Musculoskeletal: Normal range of motion.  Neurological: She is alert and oriented to person, place, and time. Coordination normal.  Speech is goal-oriented. Moves limbs without ataxia.   Skin: Skin is warm and dry.  Psychiatric: She has a normal mood and affect. Her behavior is normal.  Nursing note and vitals reviewed.   ED Course  Procedures (including critical care  time) Labs Review Labs Reviewed  LIPASE, BLOOD - Abnormal; Notable for the following:    Lipase 16 (*)    All other components within normal limits  COMPREHENSIVE METABOLIC PANEL - Abnormal; Notable for the following:    BUN <5 (*)    ALT 11 (*)    Total Bilirubin 1.3 (*)    All other components within normal limits  CBC - Abnormal; Notable for the following:    RBC 5.31 (*)    All other components within normal limits  URINALYSIS, ROUTINE W REFLEX MICROSCOPIC  (NOT AT [facility]) - Abnormal; Notable for the following:    Hgb urine dipstick TRACE (*)    Ketones, ur 15 (*)    All other components within normal limits  URINE CULTURE  URINE MICROSCOPIC-ADD ON  POC URINE PREG, ED    Imaging Review US Abdomen Limited  02/14/2015   CLINICAL DATA:  Right upper quadrant pain for 2 months.  EXAM: US ABDOMEN LIMITED - RIGHT UPPER QUADRANT  COMPARISON:  None.  FINDINGS: Gallbladder:  No gallstones or wall thickening visualized. No sonographic Murphy sign noted.  Common bile duct:  Diameter: 3 mm, normal  Liver:  No focal lesion identified. Within normal limits in parenchymal echogenicity.  IMPRESSION: Normal examination.   Electronically Signed   By: Burman Nieves M.D.   On: 02/14/2015 22:14   I have personally reviewed and evaluated these images and lab results as part of my medical decision-making.   EKG Interpretation None      MDM   Final diagnoses:  RUQ abdominal pain    8:10 PM Labs and urinalysis unremarkable for acute changes. Urinalysis shows hemoglobin due to menstrual cycle. Patient mildly tachycardic due to discomfort. RUQ [place] pending.   US shows not acute gallbladder changes. Patient will be referred to GI and general surgery for further evaluation. Patient will be discharged with pain and nausea medication. Vitals stable and patient afebrile.   Emilia Beck, PA-C 02/17/15 0335  Laurence Spates, MD 02/17/15 [phone]

## 2015-02-14 NOTE — ED Notes (Signed)
Patient transported to Ultrasound 

## 2015-02-14 NOTE — ED Notes (Signed)
Pt would not like additional pain or nausea medication at this time.

## 2015-02-15 LAB — URINE CULTURE: Culture: 2000

## 2015-02-15 MED ORDER — OXYCODONE-ACETAMINOPHEN 5-325 MG PO TABS: 2.0000 | ORAL_TABLET | ORAL | Status: DC | PRN

## 2015-02-15 MED ORDER — PROMETHAZINE HCL 25 MG PO TABS: 25.0000 mg | ORAL_TABLET | Freq: Four times a day (QID) | ORAL | Status: DC | PRN

## 2015-02-15 MED ORDER — ONDANSETRON 4 MG PO TBDP: 4.0000 mg | ORAL_TABLET | Freq: Three times a day (TID) | ORAL | Status: DC | PRN

## 2015-02-15 NOTE — Discharge Instructions (Signed)
Take Percocet as needed for pain. Take zofran or phenergan as needed for nausea. Follow the dietary restrictions listed in the attached information. Refer to attached documents for more information. Follow up with General Surgery and Gastroenterology for further evaluation and management of your abdominal pain.

## 2015-04-06 ENCOUNTER — Encounter (HOSPITAL_COMMUNITY): Payer: Self-pay | Admitting: Emergency Medicine

## 2015-04-06 ENCOUNTER — Emergency Department (HOSPITAL_COMMUNITY)
Admission: EM | Admit: 2015-04-06 | Discharge: 2015-04-06 | Disposition: A | Discharge status: Home / Self Care | Payer: Self-pay | Attending: Emergency Medicine | Admitting: Emergency Medicine

## 2015-04-06 DIAGNOSIS — N760 Acute vaginitis: Secondary | ICD-10-CM | POA: Insufficient documentation

## 2015-04-06 DIAGNOSIS — Z880 Allergy status to penicillin: Secondary | ICD-10-CM | POA: Insufficient documentation

## 2015-04-06 DIAGNOSIS — B9689 Other specified bacterial agents as the cause of diseases classified elsewhere: Secondary | ICD-10-CM

## 2015-04-06 DIAGNOSIS — Z87442 Personal history of urinary calculi: Secondary | ICD-10-CM | POA: Insufficient documentation

## 2015-04-06 DIAGNOSIS — Z8719 Personal history of other diseases of the digestive system: Secondary | ICD-10-CM | POA: Insufficient documentation

## 2015-04-06 DIAGNOSIS — Z8709 Personal history of other diseases of the respiratory system: Secondary | ICD-10-CM | POA: Insufficient documentation

## 2015-04-06 DIAGNOSIS — Z3202 Encounter for pregnancy test, result negative: Secondary | ICD-10-CM | POA: Insufficient documentation

## 2015-04-06 DIAGNOSIS — Z87448 Personal history of other diseases of urinary system: Secondary | ICD-10-CM | POA: Insufficient documentation

## 2015-04-06 LAB — URINE MICROSCOPIC-ADD ON

## 2015-04-06 LAB — CBC
HCT: 42.7 % (ref 36.0–46.0)
Hemoglobin: 14 g/dL (ref 12.0–15.0)
MCH: 27.2 pg (ref 26.0–34.0)
MCHC: 32.8 g/dL (ref 30.0–36.0)
MCV: 82.9 fL (ref 78.0–100.0)
Platelets: 263 10*3/uL (ref 150–400)
RBC: 5.15 MIL/uL — ABNORMAL HIGH (ref 3.87–5.11)
RDW: 13.9 % (ref 11.5–15.5)
WBC: 7.9 10*3/uL (ref 4.0–10.5)

## 2015-04-06 LAB — COMPREHENSIVE METABOLIC PANEL
ALT: 13 U/L — ABNORMAL LOW (ref 14–54)
AST: 20 U/L (ref 15–41)
Albumin: 4.5 g/dL (ref 3.5–5.0)
Alkaline Phosphatase: 65 U/L (ref 38–126)
Anion gap: 8 (ref 5–15)
BUN: <5 mg/dL — ABNORMAL LOW (ref 6–20)
CO2: 24 mmol/L (ref 22–32)
Calcium: 9.6 mg/dL (ref 8.9–10.3)
Chloride: 106 mmol/L (ref 101–111)
Creatinine, Ser: 0.92 mg/dL (ref 0.44–1.00)
GFR calc Af Amer: >60 mL/min (ref >60)
GFR calc non Af Amer: >60 mL/min (ref >60)
Glucose, Bld: 92 mg/dL (ref 65–99)
Potassium: 4 mmol/L (ref 3.5–5.1)
Sodium: 138 mmol/L (ref 135–145)
Total Bilirubin: 1.8 mg/dL — ABNORMAL HIGH (ref 0.3–1.2)
Total Protein: 7.9 g/dL (ref 6.5–8.1)

## 2015-04-06 LAB — URINALYSIS, ROUTINE W REFLEX MICROSCOPIC
Bilirubin Urine: NEGATIVE
Glucose, UA: NEGATIVE mg/dL
Ketones, ur: 15 mg/dL — AB
Nitrite: NEGATIVE
Protein, ur: 100 mg/dL — AB
Specific Gravity, Urine: 1.01 (ref 1.005–1.030)
pH: 6 (ref 5.0–8.0)

## 2015-04-06 LAB — WET PREP, GENITAL
Sperm: NONE SEEN
Trich, Wet Prep: NONE SEEN
Yeast Wet Prep HPF POC: NONE SEEN

## 2015-04-06 LAB — POC URINE PREG, ED: Preg Test, Ur: NEGATIVE

## 2015-04-06 LAB — LIPASE, BLOOD: Lipase: 21 U/L (ref 11–51)

## 2015-04-06 MED ORDER — METRONIDAZOLE 500 MG PO TABS: 500.0000 mg | ORAL_TABLET | Freq: Two times a day (BID) | ORAL | Status: DC

## 2015-04-06 MED ORDER — OXYCODONE-ACETAMINOPHEN 5-325 MG PO TABS
ORAL_TABLET | ORAL | Status: AC
  Administered 2015-04-06: 1
  Filled 2015-04-06: qty 1

## 2015-04-06 MED ORDER — OXYCODONE-ACETAMINOPHEN 5-325 MG PO TABS
1.0000 | ORAL_TABLET | Freq: Once | ORAL | Status: AC
  Administered 2015-04-06: 1 via ORAL
  Filled 2015-04-06: qty 1

## 2015-04-06 MED ORDER — SODIUM CHLORIDE 0.9 % IV BOLUS (SEPSIS): 1000.0000 mL | Freq: Once | INTRAVENOUS | Status: DC

## 2015-04-06 MED ORDER — OXYCODONE-ACETAMINOPHEN 5-325 MG PO TABS
1.0000 | ORAL_TABLET | Freq: Once | ORAL | Status: AC
  Administered 2015-04-06: 1 via ORAL

## 2015-04-06 MED ORDER — ONDANSETRON 4 MG PO TBDP
4.0000 mg | ORAL_TABLET | Freq: Once | ORAL | Status: AC
Start: 2015-04-06 — End: 2015-04-06
  Administered 2015-04-06: 4 mg via ORAL
  Filled 2015-04-06: qty 1

## 2015-04-06 NOTE — ED Provider Notes (Signed)
CSN: [id number]     Arrival date & time 04/06/15  1512 History   First MD Initiated Contact with Patient 04/06/15 2052     Chief Complaint  Patient presents with  . Abdominal Pain     (Consider location/radiation/quality/duration/timing/severity/associated sxs/prior Treatment) Patient is a 24 y.o. female presenting with abdominal pain. The history is provided by the patient and medical records. No language interpreter was used.  Abdominal Pain Associated symptoms: nausea   Associated symptoms: no constipation, no cough, no diarrhea, no shortness of breath, no sore throat and no vomiting    Christy Little is a 24 y.o. female  with a PMH of gastritis who presents to the Emergency Department complaining of intermittent, crampy, generalized abdominal pain, worse in bilat lower abdomen since 2014. Pt. States that over the past 2-3 months, during her menstrual cycle pain has been worse than in the past. Menses often heavy, other times light, not regular time frames. No radiation of pain, no other associated symptoms. Last BM was this am. No medications taken PTA, Heating pad helps with pain. Pain is worst each month with menses, but will occasionally occur when not on cycle.    Past Medical History  Diagnosis Date  . Sinus infection   . Constipation   . Gastritis, acute   . Renal disorder 10/2011    kidney stone   History reviewed. No pertinent past surgical history. Family History  Problem Relation Age of Onset  . Cancer Mother     uterus   Social History  Substance Use Topics  . Smoking status: Never Smoker   . Smokeless tobacco: None  . Alcohol Use: Yes     Comment: occasional   OB History    No data available     Review of Systems  Constitutional: Negative.   HENT: Negative for congestion, rhinorrhea and sore throat.   Eyes: Negative for visual disturbance.  Respiratory: Negative for cough, shortness of breath and wheezing.   Cardiovascular: Negative.   Gastrointestinal:  Positive for nausea and abdominal pain. Negative for vomiting, diarrhea and constipation.  Endocrine: Negative for polydipsia and polyuria.  Musculoskeletal: Negative for myalgias, back pain, arthralgias and neck pain.  Skin: Negative for rash.  Neurological: Negative for dizziness, weakness and headaches.      Allergies  Amoxicillin  Home Medications   Prior to Admission medications   Medication Sig Start Date End Date Taking? Authorizing Provider  acetaminophen (TYLENOL) 500 MG tablet Take 1,000 mg by mouth every 6 (six) hours as needed for moderate pain.   Yes Historical Provider, MD  metroNIDAZOLE (FLAGYL) 500 MG tablet Take 1 tablet (500 mg total) by mouth 2 (two) times daily. 04/06/15   Jaime Pilcher Ward, PA-C   BP 125/88 mmHg  Pulse 75  Temp(Src) 98.4 F (36.9 C) (Oral)  Resp 19  Ht 5\' 5"  (1.651 m)  Wt 52.164 kg  BMI 19.14 kg/m2  SpO2 100%  LMP 04/06/2015 Physical Exam  Constitutional: She is oriented to person, place, and time. She appears well-developed and well-nourished.  Alert and in no acute distress  HENT:  Head: Normocephalic and atraumatic.  Cardiovascular: Normal rate, regular rhythm, normal heart sounds and intact distal pulses.  Exam reveals no gallop and no friction rub.   No murmur heard. Pulmonary/Chest: Effort normal and breath sounds normal. No respiratory distress. She has no wheezes. She has no rales. She exhibits no tenderness.  Abdominal: She exhibits no mass. There is no rebound and no guarding.  Abdomen  soft, non-distended Generalized TTP of abdomen Bowel sounds positive in all four quadrants  Genitourinary: There is no rash, tenderness, lesion or injury on the right labia. There is no rash, tenderness, lesion or injury on the left labia. There is bleeding in the vagina. No erythema or tenderness in the vagina. No foreign body around the vagina. No signs of injury around the vagina. No vaginal discharge found.  Pt. On menstrual cycle with  menstrual bleeding noted on exam.   Musculoskeletal: She exhibits no edema.  Neurological: She is alert and oriented to person, place, and time.  Skin: Skin is warm and dry.  Cap refill < 3 sec  Nursing note and vitals reviewed.   ED Course  Procedures (including critical care time) Labs Review Labs Reviewed  WET PREP, GENITAL - Abnormal; Notable for the following:    Clue Cells Wet Prep HPF POC PRESENT (*)    WBC, Wet Prep HPF POC MODERATE (*)    All other components within normal limits  COMPREHENSIVE METABOLIC PANEL - Abnormal; Notable for the following:    BUN <5 (*)    ALT 13 (*)    Total Bilirubin 1.8 (*)    All other components within normal limits  CBC - Abnormal; Notable for the following:    RBC 5.15 (*)    All other components within normal limits  URINALYSIS, ROUTINE W REFLEX MICROSCOPIC (NOT AT [facility]) - Abnormal; Notable for the following:    Color, Urine RED (*)    APPearance CLOUDY (*)    Hgb urine dipstick LARGE (*)    Ketones, ur 15 (*)    Protein, ur 100 (*)    Leukocytes, UA MODERATE (*)    All other components within normal limits  URINE MICROSCOPIC-ADD ON - Abnormal; Notable for the following:    Squamous Epithelial / LPF 0-5 (*)    Bacteria, UA RARE (*)    All other components within normal limits  LIPASE, BLOOD  RPR  HIV ANTIBODY (ROUTINE TESTING)  POC URINE PREG, ED  GC/CHLAMYDIA PROBE AMP ([place]) NOT AT [facility]    Imaging Review No results found. I have personally reviewed and evaluated these images and lab results as part of my medical decision-making.   EKG Interpretation None      MDM   Final diagnoses:  Bacterial vaginosis   Christy Little presents with abdominal pain worst in lower abdomen since 2014. Has had negative u/s when seen here for similar sxs last month - likely related to menstrual cycle as it comes and goes with cycle each month. Pt. Has heavier periods than normal and not at regular time frames. No GYN provider at this  time. Will refer for gyn follow-up  Labs: Wet prep + for clue cells, moderate WBC, + leuks; Other labs reassuring.  G/C, HIV, and RPR sent - patient aware that she will receive a phone call if results are positive.   10:35 - nurse made me aware that patient is screaming in hallway requesting pain medication. I immediately saw patient. She only had one percocet at arrival ~ 3pm and has not made any previous requests for more pain meds until this incident. Gave her another Microbiologist, consulted social work  Social work has helped with GYN follow-up and discussed anger and anxiety issues with patient. Patient is much more calm at this time, and has apologized to nursing staff and myself multiple times for her outburst - appears genuinely sorry about incident.   Discussed diagnosis  and plan with patient, stressed GYN follow-up, answered all questions.   [facility] Ward, PA-C 04/06/15 2354  Zadie Rhine, MD 04/08/15 [phone]

## 2015-04-06 NOTE — ED Notes (Addendum)
Pt c/o of pain right above umbilicus area in abdomen. Pt states she is on her menstrual cycle and typically does have abdomen pain when her period begins but this pain is worse that her normal pain. Pt is nauseous but no vomiting. Pt also reports one episode of dark stool this morning.

## 2015-04-06 NOTE — ED Notes (Signed)
Pelvic cart at the bedside 

## 2015-04-06 NOTE — ED Notes (Signed)
Pt on nurse station become verbally aggressive and requesting pain medication stat, pt oriented that we need to wait for MD order for pain medication. Pt upset, cursing and slam the door after her, pt now crying and screaming on the hallway. Security call for safety.

## 2015-04-06 NOTE — Discharge Instructions (Signed)
1. Medications: Flagyl - do not drink alcohol on this medication!, continue usual home medications 2. Treatment: rest, drink plenty of fluids 3. Follow Up: Please follow up with the women's health provider listed above for discussion of your diagnoses and further evaluation after today's visit; Please return to the ER for any new or worsening symptoms, any additional concerns.   Bacterial Vaginosis Bacterial vaginosis is an infection of the vagina. It happens when too many germs (bacteria) grow in the vagina. Having this infection puts you at risk for getting other infections from sex. Treating this infection can help lower your risk for other infections, such as:   Chlamydia.  Gonorrhea.  HIV.  Herpes. HOME CARE  Take your medicine as told by your doctor.  Finish your medicine even if you start to feel better.  Tell your sex partner that you have an infection. They should see their doctor for treatment.  During treatment:  Avoid sex or use condoms correctly.  Do not douche.  Do not drink alcohol unless your doctor tells you it is ok.  Do not breastfeed unless your doctor tells you it is ok. GET HELP IF:  You are not getting better after 3 days of treatment.  You have more grey fluid (discharge) coming from your vagina than before.  You have more pain than before.  You have a fever. MAKE SURE YOU:   Understand these instructions.  Will watch your condition.  Will get help right away if you are not doing well or get worse.   This information is not intended to replace advice given to you by your health care provider. Make sure you discuss any questions you have with your health care provider.   Document Released: 02/01/2008 Document Revised: 05/15/2014 Document Reviewed: 12/04/2012 Elsevier Interactive Patient Education [employer]2016 [employer]vier Inc.

## 2015-04-06 NOTE — Care Management (Signed)
ED CM spoke with patient at bedside, patient reported complaints of severe pelvic pain monthly.  Discussed with patient the importance  establishing care for follow up, patient is agreeable. [facility]. [facility] to patient [facility] verified number to arrange an appointment to establish care, teach back done, patient verbalized understanding. No further ED CM needs identified.

## 2015-04-07 LAB — GC/CHLAMYDIA PROBE AMP (~~LOC~~) NOT AT ARMC
Chlamydia: NEGATIVE
Neisseria Gonorrhea: NEGATIVE

## 2015-04-07 LAB — RPR: RPR Ser Ql: NONREACTIVE

## 2015-04-07 LAB — HIV ANTIBODY (ROUTINE TESTING W REFLEX): HIV Screen 4th Generation wRfx: NONREACTIVE

## 2015-05-31 ENCOUNTER — Ambulatory Visit (INDEPENDENT_AMBULATORY_CARE_PROVIDER_SITE_OTHER): Payer: BLUE CROSS/BLUE SHIELD | Admitting: Obstetrics and Gynecology

## 2015-05-31 ENCOUNTER — Encounter: Payer: Self-pay | Admitting: Obstetrics and Gynecology

## 2015-05-31 VITALS — BP 118/81 | HR 68 | Temp 98.4°F | Ht 64.0 in | Wt 113.8 lb

## 2015-05-31 DIAGNOSIS — N939 Abnormal uterine and vaginal bleeding, unspecified: Secondary | ICD-10-CM

## 2015-05-31 DIAGNOSIS — Z3202 Encounter for pregnancy test, result negative: Secondary | ICD-10-CM

## 2015-05-31 DIAGNOSIS — Z3042 Encounter for surveillance of injectable contraceptive: Secondary | ICD-10-CM

## 2015-05-31 LAB — POCT PREGNANCY, URINE: Preg Test, Ur: NEGATIVE

## 2015-05-31 MED ORDER — MEDROXYPROGESTERONE ACETATE 150 MG/ML IM SUSP
150.0000 mg | INTRAMUSCULAR | Status: AC
  Administered 2015-05-31 – 2016-01-31 (×3): 150 mg via INTRAMUSCULAR

## 2015-05-31 NOTE — Progress Notes (Signed)
Patient ID: Christy Little, female   DOB: 1991-03-30, 25 y.o.   MRN: [id number] 25 yo G0 here with irregular menses. Patient reports onset of irregular cycles since the discontinuation of depo-provera. She reports having 2 cycles per month at times, random cramping pain not always in association with her cycle and some RUQ pain. She is sexually active now using condoms. Patient would like to get restarted on depo-provera for contraception. She suffers from IBS and strongly feels that her cramps are related to her intestines rather than her pelvic organs as the location of her pain is mainly RUQ and epigastric.  Past Medical History  Diagnosis Date  . Sinus infection   . Constipation   . Gastritis, acute   . Renal disorder 10/2011    kidney stone   No past surgical history on file. Family History  Problem Relation Age of Onset  . Cancer Mother     uterus   Social History  Substance Use Topics  . Smoking status: Never Smoker   . Smokeless tobacco: None  . Alcohol Use: Yes     Comment: occasional   ROS See pertinent in HPI  Blood pressure 118/81, pulse 68, temperature 98.4 F (36.9 C), temperature source Oral, height  (1.626 m), weight 113 lb 12.8 oz (51.619 kg), last menstrual period 05/22/2015. GENERAL: Well-developed, well-nourished female in no acute distress.  ABDOMEN: Soft, nontender, nondistended. No organomegaly. PELVIC: Not performed. EXTREMITIES: No cyanosis, clubbing, or edema, 2+ distal pulses.  A/P 25 yo with abnormal uterine bleeding and upper abdominal discomfort - Will order pelvic ultrasound to rule out any other etiology of her DUB - Will restart depo-provera per her request - Advised to follow up with PCP or GI  - patient will be contacted with any abnormal results - RTC prn

## 2015-05-31 NOTE — Progress Notes (Signed)
[place] scheduled for January 31st @ 1400.  Pt notified.

## 2015-06-08 ENCOUNTER — Ambulatory Visit (HOSPITAL_COMMUNITY): Payer: BC Managed Care – PPO

## 2015-06-18 ENCOUNTER — Ambulatory Visit (HOSPITAL_COMMUNITY)
Admission: RE | Admit: 2015-06-18 | Discharge: 2015-06-18 | Disposition: A | Discharge status: Home / Self Care | Payer: BLUE CROSS/BLUE SHIELD | Source: Ambulatory Visit | Attending: Obstetrics and Gynecology | Admitting: Obstetrics and Gynecology

## 2015-06-18 DIAGNOSIS — N939 Abnormal uterine and vaginal bleeding, unspecified: Secondary | ICD-10-CM | POA: Diagnosis not present

## 2015-06-22 ENCOUNTER — Telehealth: Payer: Self-pay | Admitting: *Deleted

## 2015-06-22 NOTE — Telephone Encounter (Signed)
Per Dr. Jolayne Panther need to call patient and tell her ultrasound was normal.     Called Christy Little and left a message we are calling with some results that are non-urgent- please call us during office hours. If you are comfortable with  [place] leaving information on voicemail, let us know that in your message.

## 2015-06-22 NOTE — Telephone Encounter (Signed)
Christy Little called back and I gave her the results. She states glad it was normal, but not sure why she is still having bleeding and pain issues. We discussed with depo- provera may not see improvement until on 3rd shot. So she should call to make appointment if pain/bleeding worsen or if no improvement after 3rd shot. She voices understanding.

## 2015-07-11 ENCOUNTER — Emergency Department (HOSPITAL_COMMUNITY): Payer: BLUE CROSS/BLUE SHIELD

## 2015-07-11 ENCOUNTER — Encounter (HOSPITAL_COMMUNITY): Payer: Self-pay | Admitting: Family Medicine

## 2015-07-11 ENCOUNTER — Emergency Department (HOSPITAL_COMMUNITY)
Admission: EM | Admit: 2015-07-11 | Discharge: 2015-07-12 | Disposition: A | Discharge status: Home / Self Care | Payer: BLUE CROSS/BLUE SHIELD | Attending: Emergency Medicine | Admitting: Emergency Medicine

## 2015-07-11 DIAGNOSIS — G8929 Other chronic pain: Secondary | ICD-10-CM | POA: Diagnosis not present

## 2015-07-11 DIAGNOSIS — Z8709 Personal history of other diseases of the respiratory system: Secondary | ICD-10-CM | POA: Diagnosis not present

## 2015-07-11 DIAGNOSIS — Z3202 Encounter for pregnancy test, result negative: Secondary | ICD-10-CM | POA: Insufficient documentation

## 2015-07-11 DIAGNOSIS — Z87448 Personal history of other diseases of urinary system: Secondary | ICD-10-CM | POA: Insufficient documentation

## 2015-07-11 DIAGNOSIS — Z8719 Personal history of other diseases of the digestive system: Secondary | ICD-10-CM | POA: Diagnosis not present

## 2015-07-11 DIAGNOSIS — Z880 Allergy status to penicillin: Secondary | ICD-10-CM | POA: Insufficient documentation

## 2015-07-11 DIAGNOSIS — R1011 Right upper quadrant pain: Secondary | ICD-10-CM | POA: Diagnosis not present

## 2015-07-11 LAB — COMPREHENSIVE METABOLIC PANEL
ALT: 10 U/L — ABNORMAL LOW (ref 14–54)
AST: 17 U/L (ref 15–41)
Albumin: 4.6 g/dL (ref 3.5–5.0)
Alkaline Phosphatase: 56 U/L (ref 38–126)
Anion gap: 9 (ref 5–15)
BUN: 10 mg/dL (ref 6–20)
CO2: 22 mmol/L (ref 22–32)
Calcium: 9.1 mg/dL (ref 8.9–10.3)
Chloride: 105 mmol/L (ref 101–111)
Creatinine, Ser: 0.88 mg/dL (ref 0.44–1.00)
GFR calc Af Amer: >60 mL/min (ref >60)
GFR calc non Af Amer: >60 mL/min (ref >60)
Glucose, Bld: 107 mg/dL — ABNORMAL HIGH (ref 65–99)
Potassium: 3.8 mmol/L (ref 3.5–5.1)
Sodium: 136 mmol/L (ref 135–145)
Total Bilirubin: 1.1 mg/dL (ref 0.3–1.2)
Total Protein: 7.8 g/dL (ref 6.5–8.1)

## 2015-07-11 LAB — CBC
HCT: 41.8 % (ref 36.0–46.0)
Hemoglobin: 13.4 g/dL (ref 12.0–15.0)
MCH: 27.6 pg (ref 26.0–34.0)
MCHC: 32.1 g/dL (ref 30.0–36.0)
MCV: 86 fL (ref 78.0–100.0)
Platelets: 291 10*3/uL (ref 150–400)
RBC: 4.86 MIL/uL (ref 3.87–5.11)
RDW: 14.7 % (ref 11.5–15.5)
WBC: 7.9 10*3/uL (ref 4.0–10.5)

## 2015-07-11 LAB — URINALYSIS, ROUTINE W REFLEX MICROSCOPIC
Glucose, UA: NEGATIVE mg/dL
Hgb urine dipstick: NEGATIVE
Ketones, ur: NEGATIVE mg/dL
Leukocytes, UA: NEGATIVE
Nitrite: NEGATIVE
Protein, ur: NEGATIVE mg/dL
Specific Gravity, Urine: 1.038 — ABNORMAL HIGH (ref 1.005–1.030)
pH: 6 (ref 5.0–8.0)

## 2015-07-11 LAB — LIPASE, BLOOD: Lipase: 22 U/L (ref 11–51)

## 2015-07-11 LAB — POC URINE PREG, ED: Preg Test, Ur: NEGATIVE

## 2015-07-11 MED ORDER — HYDROCODONE-ACETAMINOPHEN 5-325 MG PO TABS
1.0000 | ORAL_TABLET | Freq: Once | ORAL | Status: AC
  Administered 2015-07-11: 1 via ORAL
  Filled 2015-07-11: qty 1

## 2015-07-11 NOTE — ED Notes (Signed)
Pt is complaining of right side rib area and abd. Denies nausea, vomiting, diarrhea, and fever. Pt reports symptoms have been occuring intermittently since 2014. Been seen for symptoms multiple times but got worse this past week.

## 2015-07-12 MED ORDER — HYOSCYAMINE SULFATE ER 0.375 MG PO TB12: 0.3750 mg | ORAL_TABLET | Freq: Two times a day (BID) | ORAL | Status: DC

## 2015-07-12 MED ORDER — POLYETHYLENE GLYCOL 3350 17 GM/SCOOP PO POWD: ORAL | Status: DC

## 2015-07-12 NOTE — ED Provider Notes (Signed)
CSN: [id number]     Arrival date & time 07/11/15  2133 History   First MD Initiated Contact with Patient 07/11/15 2226     Chief Complaint  Patient presents with  . Abdominal Pain     (Consider location/radiation/quality/duration/timing/severity/associated sxs/prior Treatment) The history is provided by the patient.   Christy Little is a 25 y.o. female presenting with acute on intermittent chronic right upper quadrant pain which has been present for the past 4 days.  She describes having intermittent episodes of similar pain the past 3 years and endorses having multiple tests including an ultrasound to rule out gallbladder disease 3 months ago.  She denies nausea, vomiting, diarrhea, fevers or chills. She does have occasional problems with constipation, but last bm was yesterday, denies straining but did have hard stool.  She has had no treatment for constipation. She has a history of kidney stones, but denies this pain is similar.  Denies dysuria, hematuria or flank pain. She has told in the past she probably has IBS.  She has found no alleviators for her symptoms.     Past Medical History  Diagnosis Date  . Sinus infection   . Constipation   . Gastritis, acute   . Renal disorder 10/2011    kidney stone   History reviewed. No pertinent past surgical history. Family History  Problem Relation Age of Onset  . Cancer Mother     uterus   Social History  Substance Use Topics  . Smoking status: Never Smoker   . Smokeless tobacco: None  . Alcohol Use: Yes     Comment: Once every two weeks.    OB History    No data available     Review of Systems  Constitutional: Negative for fever.  HENT: Negative for congestion and sore throat.   Eyes: Negative.   Respiratory: Negative for chest tightness and shortness of breath.   Cardiovascular: Negative for chest pain.  Gastrointestinal: Positive for abdominal pain. Negative for nausea and vomiting.  Genitourinary: Negative.  Negative for  dysuria, urgency, hematuria and flank pain.  Musculoskeletal: Negative for joint swelling, arthralgias and neck pain.  Skin: Negative.  Negative for rash and wound.  Neurological: Negative for dizziness, weakness, light-headedness, numbness and headaches.  Psychiatric/Behavioral: Negative.       Allergies  Amoxicillin  Home Medications   Prior to Admission medications   Medication Sig Start Date End Date Taking? Authorizing Provider  acetaminophen (TYLENOL) 500 MG tablet Take 1,000 mg by mouth every 6 (six) hours as needed for moderate pain. Reported on 05/31/2015   Yes Historical Provider, MD  Naproxen Sodium (ALEVE) 220 MG CAPS Take 440 mg by mouth every 12 (twelve) hours as needed (pain).    Yes Historical Provider, MD  hyoscyamine (LEVBID) 0.375 MG 12 hr tablet Take 1 tablet (0.375 mg total) by mouth 2 (two) times daily. 07/12/15   Burgess AmorJulie Vernis Eid, PA-C  metroNIDAZOLE (FLAGYL) 500 MG tablet Take 1 tablet (500 mg total) by mouth 2 (two) times daily. Patient not taking: Reported on 05/31/2015 04/06/15   [facility] Center[facility]ime Pilcher Ward, PA-C  polyethylene glycol powder [facility](MIRALAX) powder Take 1 dose mixed in fluid of choice 1 to 2 times daily 07/12/15   Burgess AmorJulie Dequane Strahan, PA-C   BP 126/72 mmHg  Pulse 96  Temp(Src) 98.1 F (36.7 C) (Oral)  Resp 18  Ht 5\' 2"  (1.575 m)  Wt 52.164 kg  BMI 21.03 kg/m2  SpO2 99%  LMP 06/16/2015 Physical Exam  Constitutional: She appears well-developed and  well-nourished.  HENT:  Head: Normocephalic and atraumatic.  Eyes: Conjunctivae are normal.  Neck: Normal range of motion.  Cardiovascular: Normal rate, regular rhythm, normal heart sounds and intact distal pulses.   Pulmonary/Chest: Effort normal and breath sounds normal. She has no wheezes.  Abdominal: Soft. Bowel sounds are normal. There is tenderness in the right upper quadrant. There is no guarding, no CVA tenderness, no tenderness at McBurney's point and negative Murphy's sign.  Musculoskeletal: Normal range of motion.   Neurological: She is alert.  Skin: Skin is warm and dry.  Psychiatric: She has a normal mood and affect.  Nursing note and vitals reviewed.   ED Course  Procedures (including critical care time) Labs Review Labs Reviewed  COMPREHENSIVE METABOLIC PANEL - Abnormal; Notable for the following:    Glucose, Bld 107 (*)    ALT 10 (*)    All other components within normal limits  URINALYSIS, ROUTINE W REFLEX MICROSCOPIC (NOT AT [facility]) - Abnormal; Notable for the following:    Color, Urine AMBER (*)    APPearance CLOUDY (*)    Specific Gravity, Urine 1.038 (*)    Bilirubin Urine SMALL (*)    All other components within normal limits  LIPASE, BLOOD  CBC  POC URINE PREG, ED    Imaging Review Dg Abd Acute W/chest  07/12/2015  CLINICAL DATA:  Right-sided rib pain and abdominal pain. EXAM: DG ABDOMEN ACUTE W/ 1V CHEST COMPARISON:  None. FINDINGS: Normal heart size and pulmonary vascularity. No focal airspace disease or consolidation in the lungs. No blunting of costophrenic angles. No pneumothorax. Mediastinal contours appear intact. Scattered gas and stool in the colon. No small or large bowel distention. No free intra-abdominal air. No abnormal air-fluid levels. No radiopaque stones. Visualized bones appear intact. IMPRESSION: No evidence of active pulmonary disease. Normal nonobstructive bowel gas pattern. Electronically Signed   By: Burman Nieves M.D.   On: 07/12/2015 00:35   I have personally reviewed and evaluated these images and lab results as part of my medical decision-making.   EKG Interpretation None      MDM   Final diagnoses:  Right upper quadrant pain    Patients labs reviewed.  Radiological studies were viewed, interpreted and considered during the medical decision making and disposition process. I agree with radiologists reading.  Results were also discussed with patient.  Pt given hydrocodone tab with complete resolution of sx. No emesis, re-exam no acute abdomen, no  guard or rebound.  Acute on chronic abd pain, suspect IBS or gas trapping as source of pain.  Labs negative, wbc normal. No right lower quad pain to suggest appy, no pelvic pain. Pt was prescribed miralax for constipation, also trial of levbid for spasm. Advised recheck -discussed obtaining pcp, in interim, recheck here for any worsened or persistent sx.  The patient appears reasonably screened and/or stabilized for discharge and I doubt any other medical condition or other [facility] requiring further screening, evaluation, or treatment in the ED at this time prior to discharge.     Burgess Amor, PA-C 07/12/15 1146  Nelva Nay, MD 07/12/15 [phone]

## 2015-08-16 ENCOUNTER — Ambulatory Visit: Payer: BC Managed Care – PPO

## 2015-08-18 ENCOUNTER — Ambulatory Visit (INDEPENDENT_AMBULATORY_CARE_PROVIDER_SITE_OTHER): Payer: BLUE CROSS/BLUE SHIELD | Admitting: General Practice

## 2015-08-18 VITALS — BP 120/71 | HR 66 | Ht 62.0 in | Wt 117.0 lb

## 2015-08-18 DIAGNOSIS — Z3042 Encounter for surveillance of injectable contraceptive: Secondary | ICD-10-CM

## 2015-11-15 ENCOUNTER — Ambulatory Visit (INDEPENDENT_AMBULATORY_CARE_PROVIDER_SITE_OTHER): Payer: BLUE CROSS/BLUE SHIELD

## 2015-11-15 VITALS — BP 121/65 | HR 72

## 2015-11-15 DIAGNOSIS — Z3042 Encounter for surveillance of injectable contraceptive: Secondary | ICD-10-CM

## 2015-11-15 NOTE — Progress Notes (Signed)
Pt presented today for her depo-provera. Pt tolerated well and follow up between -09/26-10/09. Patient verbalizes understanding at this time.

## 2015-11-15 NOTE — Addendum Note (Signed)
Addended by: Cheree DittoGRAHAM, Delois Tolbert A on: 11/15/2015 03:27 PM   Modules accepted: Orders

## 2015-11-16 ENCOUNTER — Encounter: Payer: Self-pay | Admitting: Internal Medicine

## 2016-01-19 ENCOUNTER — Encounter: Payer: Self-pay | Admitting: Internal Medicine

## 2016-01-19 ENCOUNTER — Ambulatory Visit (INDEPENDENT_AMBULATORY_CARE_PROVIDER_SITE_OTHER): Payer: BLUE CROSS/BLUE SHIELD | Admitting: Internal Medicine

## 2016-01-19 VITALS — BP 100/58 | HR 80 | Ht 62.5 in | Wt 125.1 lb

## 2016-01-19 DIAGNOSIS — K5909 Other constipation: Secondary | ICD-10-CM | POA: Diagnosis not present

## 2016-01-19 DIAGNOSIS — R1084 Generalized abdominal pain: Secondary | ICD-10-CM | POA: Diagnosis not present

## 2016-01-19 DIAGNOSIS — K625 Hemorrhage of anus and rectum: Secondary | ICD-10-CM

## 2016-01-19 DIAGNOSIS — R194 Change in bowel habit: Secondary | ICD-10-CM

## 2016-01-19 MED ORDER — NA SULFATE-K SULFATE-MG SULF 17.5-3.13-1.6 GM/177ML PO SOLN: 1.0000 | Freq: Once | ORAL | 0 refills | Status: AC

## 2016-01-19 NOTE — Progress Notes (Signed)
HISTORY OF PRESENT ILLNESS:  Christy Little is a 25 y.o. female, art student at [employer],  who self-referred regarding chronic abdominal pain, difficulty with bowel habits, and new onset rectal bleeding. She is accompanied by her boyfriend. Patient reports a 7-8 year history of problems with different types of intermittent abdominal pain. First, she reports right upper quadrant pain which is brought on by certain stretching positions and relieved within a few minutes upon sitting quietly. Next, she mentions some postprandial left-sided abdominal discomfort which feels like a fullness. Generally associated with constipation and relieved with defecation. Finally, problems with severe abdominal cramping discomfort related to her menstrual cycle which has resolved after initiating Depo-Provera therapy. Her weight is stable. She reports new problem with significant rectal bleeding. Blood in it was a bowl on several occasions described significant. Some minor rectal discomfort at times but not always. Associated with this has been a change in her bowel habits from constipation to more loose stools. She does have some bloating. No nausea or vomiting. No reflux symptoms. No prior GI evaluation. She is on no additional medications. Takes no medications for her bowels. She was evaluated in the emergency room March 2017 for her chronic right upper quadrant abdominal pain which had been present at that time for several days. Blood work and urinalysis were unremarkable. Negative pregnancy test. Plain films were unremarkable. Prior abdominal ultrasound limited to the right upper quadrant October 2016 was negative. Also negative pelvic ultrasound earlier in the year. She does not smoke. Drinks moderate alcohol once or twice per week. No prior surgeries.  REVIEW OF SYSTEMS:  All non-GI ROS negative except for anxiety, back pain, skin rash  Past Medical History:  Diagnosis Date  . Anxiety   . Constipation   . Gastritis, acute    . IBS (irritable bowel syndrome)   . Kidney stones 10/2011  . Sinus infection     History reviewed. No pertinent surgical history.  Social History Pietro Cassislea Marlan Palauobias  reports that she has never smoked. She has never used smokeless tobacco. She reports that she drinks alcohol. She reports that she uses drugs, including Marijuana, about 1 time per week.  family history includes Uterine cancer in her mother.  Allergies  Allergen Reactions  . Amoxicillin Itching and Rash    Has patient had a PCN reaction causing immediate rash, facial/tongue/throat swelling, SOB or lightheadedness with hypotension: no Has patient had a PCN reaction causing severe rash involving mucus membranes or skin necrosis: pt had skin rash/irritation  Has patient had a PCN reaction that required hospitalization: no  Has patient had a PCN reaction occurring within the last 10 years: yes occurred 2013  If all of the above answers are "NO", then may proceed with Cephalosporin use.        PHYSICAL EXAMINATION: Vital signs: BP (!) 100/58 (BP Location: Left Arm, Patient Position: Sitting, Cuff Size: Normal)   Pulse 80   Ht 5' 2.5" (1.588 m) Comment: height measured without shoes  Wt 125 lb 2 oz (56.8 kg)   LMP 12/09/2015   BMI 22.52 kg/m   Constitutional: generally well-appearing, no acute distress Psychiatric: alert and oriented x3, cooperative Eyes: extraocular movements intact, anicteric, conjunctiva pink Mouth: oral pharynx moist, no lesions Neck: supple Without thyromegaly Lymph: no lymphadenopathy Cardiovascular: heart regular rate and rhythm, no murmur Lungs: clear to auscultation bilaterally Abdomen: soft, nontender, nondistended, no obvious ascites, no peritoneal signs, normal bowel sounds, no organomegaly Rectal:Deferred until colonoscopy Extremities: no clubbing cyanosis or lower extremity  edema bilaterally Skin: no lesions on visible extremities Neuro: No focal deficits. Brisk DTRs  ASSESSMENT:  #1.  Intermittent chronic right upper quadrant pain consistent with musculoskeletal etiology #2. Menstrual cramping relieved with Depo-Provera #3. Postprandial abdominal discomfort, left-sided discomfort relieved with defecation. Related to constipation #4. Recent problems with rectal bleeding of uncertain etiology #5. Change in bowel habits from constipation to more loose stools   PLAN:  #1. Discussed musculoskeletal pain #2. Discussed pain related to constipation #3. Colonoscopy to evaluate new onset rectal bleeding and change in bowel habits. Rule out colitis. Rule out neoplasia.The nature of the procedure, as well as the risks, benefits, and alternatives were carefully and thoroughly reviewed with the patient. Ample time for discussion and questions allowed. The patient understood, was satisfied, and agreed to proceed. #4. May benefit from regular fiber supplementation and as needed antispasmodics pending the above workup.

## 2016-01-19 NOTE — Patient Instructions (Signed)
You have been scheduled for a colonoscopy. Please follow written instructions given to you at your visit today.  Please pick up your prep supplies at the pharmacy within the next 1-3 days. If you use inhalers (even only as needed), please bring them with you on the day of your procedure.   

## 2016-01-26 ENCOUNTER — Encounter: Payer: Self-pay | Admitting: Internal Medicine

## 2016-01-26 ENCOUNTER — Ambulatory Visit (AMBULATORY_SURGERY_CENTER): Payer: BLUE CROSS/BLUE SHIELD | Admitting: Internal Medicine

## 2016-01-26 VITALS — BP 102/63 | HR 83 | Temp 98.7°F | Resp 14 | Ht 62.0 in | Wt 135.0 lb

## 2016-01-26 DIAGNOSIS — R194 Change in bowel habit: Secondary | ICD-10-CM | POA: Diagnosis not present

## 2016-01-26 DIAGNOSIS — K625 Hemorrhage of anus and rectum: Secondary | ICD-10-CM | POA: Diagnosis not present

## 2016-01-26 DIAGNOSIS — R1084 Generalized abdominal pain: Secondary | ICD-10-CM

## 2016-01-26 MED ORDER — SODIUM CHLORIDE 0.9 % IV SOLN: 500.0000 mL | INTRAVENOUS | Status: DC

## 2016-01-26 MED ORDER — DICYCLOMINE HCL 10 MG PO CAPS: 10.0000 mg | ORAL_CAPSULE | ORAL | 3 refills | Status: DC | PRN

## 2016-01-26 NOTE — Op Note (Signed)
[facility] Patient Name: Christy Little Procedure Date: 01/26/2016 11:00 AM MRN: [id number] Endoscopist: Wilhemina Bonito. Marina Goodell , MD Age: 25 Referring MD:  Date of Birth: 1990/07/27 Gender: Female Account #: [id number] Procedure:                Colonoscopy Indications:              Rectal bleeding, Abdominal pain, Change in bowel                            habits Medicines:                Monitored Anesthesia Care Procedure:                Pre-Anesthesia Assessment:                           - Prior to the procedure, a History and Physical                            was performed, and patient medications and                            allergies were reviewed. The patient's tolerance of                            previous anesthesia was also reviewed. The risks                            and benefits of the procedure and the sedation                            options and risks were discussed with the patient.                            All questions were answered, and informed consent                            was obtained. Prior Anticoagulants: The patient has                            taken no previous anticoagulant or antiplatelet                            agents. ASA Grade Assessment: I - A normal, healthy                            patient. After reviewing the risks and benefits,                            the patient was deemed in satisfactory condition to                            undergo the procedure.  After obtaining informed consent, the colonoscope                            was passed under direct vision. Throughout the                            procedure, the patient's blood pressure, pulse, and                            oxygen saturations were monitored continuously. The                            Model CF-HQ190L [id number]) scope was introduced                            through the anus and advanced to the the cecum,         identified by appendiceal orifice and ileocecal                            valve. The terminal ileum, ileocecal valve,                            appendiceal orifice, and rectum were photographed.                            The quality of the bowel preparation was excellent.                            The colonoscopy was performed without difficulty.                            The patient tolerated the procedure well. The bowel                            preparation used was SUPREP. Scope In: 11:11:06 AM Scope Out: 11:18:59 AM Scope Withdrawal Time: 0 hours 5 minutes 33 seconds  Total Procedure Duration: 0 hours 7 minutes 53 seconds  Findings:                 The terminal ileum appeared normal.                           Internal hemorrhoids were found during                            retroflexion. The hemorrhoids were small.                           The entire examined colon appeared normal on direct                            and retroflexion views. Complications:            No immediate complications. Estimated blood loss:  None. Estimated Blood Loss:     Estimated blood loss: none. Impression:               - The examined portion of the ileum was normal.                           - Internal hemorrhoids.                           - The entire examined colon is normal on direct and                            retroflexion views.                           - No specimens collected. Recommendation:           - Daily fiber supplementation with Metamucil or                            Citrucel one to 2 tablespoons each dayn discharge                            instructions were provided to the patient.                           - Prescribed Bentyl 10 mg; #30; 3 refills. Take 1-2                            every 4-6 hours as needed for abdominal pain.                           - Routine office follow-up with Dr. Marina GoodellPerry in about                            8-10  weeks. Wilhemina BonitoJohn N. Marina GoodellPerry, MD 01/26/2016 11:25:09 AM This report has been signed electronically.

## 2016-01-26 NOTE — Progress Notes (Signed)
To PACU, vss patent aw report to rnTo PACU, vss patent aw report to rn

## 2016-01-26 NOTE — Patient Instructions (Signed)
Discharge instructions given. Handout on hemorrhoids. Prescription sent to pharmacy. Resume previous medications. YOU HAD AN ENDOSCOPIC PROCEDURE TODAY AT THE [facility]:   Refer to the procedure report that was given to you for any specific questions about what was found during the examination.  If the procedure report does not answer your questions, please call your gastroenterologist to clarify.  If you requested that your care partner not be given the details of your procedure findings, then the procedure report has been included in a sealed envelope for you to review at your convenience later.  YOU SHOULD EXPECT: Some feelings of bloating in the abdomen. Passage of more gas than usual.  Walking can help get rid of the air that was put into your GI tract during the procedure and reduce the bloating. If you had a lower endoscopy (such as a colonoscopy or flexible sigmoidoscopy) you may notice spotting of blood in your stool or on the toilet paper. If you underwent a bowel prep for your procedure, you may not have a normal bowel movement for a few days.  Please Note:  You might notice some irritation and congestion in your nose or some drainage.  This is from the oxygen used during your procedure.  There is no need for concern and it should clear up in a day or so.  SYMPTOMS TO REPORT IMMEDIATELY:   Following lower endoscopy (colonoscopy or flexible sigmoidoscopy):  Excessive amounts of blood in the stool  Significant tenderness or worsening of abdominal pains  Swelling of the abdomen that is new, acute  Fever of 100F or higher  For urgent or emergent issues, a gastroenterologist can be reached at any hour by calling [phone].   DIET:  We do recommend a small meal at first, but then you may proceed to your regular diet.  Drink plenty of fluids but you should avoid alcoholic beverages for 24 hours.  ACTIVITY:  You should plan to take it easy for the rest of today and you  should NOT DRIVE or use heavy machinery until tomorrow (because of the sedation medicines used during the test).    FOLLOW UP: Our staff will call the number listed on your records the next business day following your procedure to check on you and address any questions or concerns that you may have regarding the information given to you following your procedure. If we do not reach you, we will leave a message.  However, if you are feeling well and you are not experiencing any problems, there is no need to return our call.  We will assume that you have returned to your regular daily activities without incident.  If any biopsies were taken you will be contacted by phone or by letter within the next 1-3 weeks.  Please call us at [phone] if you have not heard about the biopsies in 3 weeks.    SIGNATURES/CONFIDENTIALITY: You and/or your care partner have signed paperwork which will be entered into your electronic medical record.  These signatures attest to the fact that that the information above on your After Visit Summary has been reviewed and is understood.  Full responsibility of the confidentiality of this discharge information lies with you and/or your care-partner.

## 2016-01-27 ENCOUNTER — Telehealth: Payer: Self-pay | Admitting: *Deleted

## 2016-01-27 NOTE — Telephone Encounter (Signed)
Left message on f/u call 

## 2016-01-31 ENCOUNTER — Ambulatory Visit (INDEPENDENT_AMBULATORY_CARE_PROVIDER_SITE_OTHER): Payer: BLUE CROSS/BLUE SHIELD | Admitting: *Deleted

## 2016-01-31 DIAGNOSIS — Z3042 Encounter for surveillance of injectable contraceptive: Secondary | ICD-10-CM

## 2016-02-03 ENCOUNTER — Ambulatory Visit (HOSPITAL_COMMUNITY)
Admission: EM | Admit: 2016-02-03 | Discharge: 2016-02-03 | Disposition: A | Discharge status: Home / Self Care | Payer: BLUE CROSS/BLUE SHIELD | Attending: Emergency Medicine | Admitting: Emergency Medicine

## 2016-02-03 ENCOUNTER — Encounter (HOSPITAL_COMMUNITY): Payer: Self-pay | Admitting: Family Medicine

## 2016-02-03 DIAGNOSIS — N730 Acute parametritis and pelvic cellulitis: Secondary | ICD-10-CM | POA: Diagnosis not present

## 2016-02-03 DIAGNOSIS — R102 Pelvic and perineal pain unspecified side: Secondary | ICD-10-CM

## 2016-02-03 DIAGNOSIS — M545 Low back pain, unspecified: Secondary | ICD-10-CM

## 2016-02-03 LAB — POCT URINALYSIS DIP (DEVICE)
Bilirubin Urine: NEGATIVE
Glucose, UA: NEGATIVE mg/dL
Hgb urine dipstick: NEGATIVE
Ketones, ur: NEGATIVE mg/dL
Leukocytes, UA: NEGATIVE
Nitrite: NEGATIVE
Protein, ur: NEGATIVE mg/dL
Specific Gravity, Urine: 1.02 (ref 1.005–1.030)
Urobilinogen, UA: 0.2 mg/dL (ref 0.0–1.0)
pH: 6.5 (ref 5.0–8.0)

## 2016-02-03 LAB — POCT PREGNANCY, URINE: Preg Test, Ur: NEGATIVE

## 2016-02-03 MED ORDER — METRONIDAZOLE 500 MG PO TABS: 500.0000 mg | ORAL_TABLET | Freq: Two times a day (BID) | ORAL | 0 refills | Status: DC

## 2016-02-03 MED ORDER — AZITHROMYCIN 250 MG PO TABS
1000.0000 mg | ORAL_TABLET | Freq: Once | ORAL | Status: AC
  Administered 2016-02-03: 1000 mg via ORAL

## 2016-02-03 MED ORDER — AZITHROMYCIN 250 MG PO TABS
ORAL_TABLET | ORAL | Status: AC
  Filled 2016-02-03: qty 1

## 2016-02-03 NOTE — Discharge Instructions (Signed)
The pain in your pelvis is caused by an infection. The specific type of infection is unknown at this time. You are being treated with to antibiotics today. It appeared testing comes back positive for other types of infection we will call you to you know and treat with other antibiotics if needed.

## 2016-02-03 NOTE — ED Triage Notes (Signed)
Pt here for lower back pain that started 2 weeks ago and then more recently started having lower abp pain. Denies any urinary symptoms but sts that she has a history of kidney stones and UTI.

## 2016-02-03 NOTE — ED Provider Notes (Signed)
CSN: [id number]     Arrival date & time 02/03/16  1009 History   First MD Initiated Contact with Patient 02/03/16 1108     Chief Complaint  Patient presents with  . Back Pain  . Pelvic Pain   (Consider location/radiation/quality/duration/timing/severity/associated sxs/prior Treatment) 25 year old female complaining of lower stomach pain for 2 weeks. His also complaining of pain across the low back for approximately the same amount of time. She places her hand across her lower abdomen and pelvis as a source of pain. Describes it as sharp, intermittent. She states she has IBS and this is not a similar discomfort to that. Low back pain is across her lower paralumbar musculature. It comes and goes. Nothing tends to elicited or exacerbated. It is not affected by bending, pulling, twisting or lifting. Denies vaginal discharge, dysuria or frequency. Occasionally she has urinary urgency especially when pressing on her "lower stomach". She is currently using Depo Medrol and had some bleeding yesterday.      Past Medical History:  Diagnosis Date  . Anxiety   . Constipation   . Gastritis, acute   . IBS (irritable bowel syndrome)   . Kidney stones 10/2011  . Sinus infection    History reviewed. No pertinent surgical history. Family History  Problem Relation Age of Onset  . Uterine cancer Mother    Social History  Substance Use Topics  . Smoking status: Never Smoker  . Smokeless tobacco: Never Used  . Alcohol use Yes     Comment: Once every two weeks.    OB History    No data available     Review of Systems  Constitutional: Negative.   HENT: Negative.   Respiratory: Negative.   Cardiovascular: Negative for chest pain and leg swelling.  Gastrointestinal: Positive for abdominal pain. Negative for nausea and vomiting.       Not having regular bowel movements.  Genitourinary: Positive for pelvic pain and vaginal bleeding. Negative for dysuria, frequency, hematuria and vaginal  discharge.  Musculoskeletal: Positive for back pain.  Skin: Negative.   Neurological: Negative.   All other systems reviewed and are negative.   Allergies  Amoxicillin  Home Medications   Prior to Admission medications   Medication Sig Start Date End Date Taking? Authorizing Provider  dicyclomine (BENTYL) 10 MG capsule Take 1 capsule (10 mg total) by mouth as needed for spasms. 01/26/16   Hilarie Fredrickson, MD  metroNIDAZOLE (FLAGYL) 500 MG tablet Take 1 tablet (500 mg total) by mouth 2 (two) times daily. X 7 days 02/03/16   Hayden Rasmussen, NP   Meds Ordered and Administered this Visit   Medications  azithromycin (ZITHROMAX) tablet 1,000 mg (not administered)    BP 126/85 (BP Location: Right Arm)   Pulse 64   Temp 99.2 F (37.3 C) (Oral)   Resp 16   LMP 01/29/2016   SpO2 100%  No data found.   Physical Exam  Constitutional: She is oriented to person, place, and time. She appears well-developed and well-nourished. No distress.  HENT:  Head: Normocephalic and atraumatic.  Eyes: EOM are normal.  Neck: Normal range of motion. Neck supple.  Cardiovascular: Normal rate.   Pulmonary/Chest: Effort normal. No respiratory distress.  Abdominal: Soft. Bowel sounds are normal. She exhibits no distension and no mass. There is tenderness. There is no rebound. No hernia.  Tenderness across the anterior pelvis. No abdominal tenderness. No rebound or guarding.  Genitourinary:  Genitourinary Comments: Normal external female genitalia. There is a small amount  of medium brown thick discharge deep within the vaginal vault. Cervix is just right of the midline and posterior. Cervix is pink with 2 nabothian cyst. Copious amount of brownish mucoid discharge from the os. This may be a mixture of blood. No bright red bleeding is seen.  Moderate to severe CMT and bilateral adnexal tenderness.   Musculoskeletal: Normal range of motion. She exhibits no edema.  Neurological: She is alert and oriented to person,  place, and time. She exhibits normal muscle tone.  Skin: Skin is warm and dry.  Psychiatric: She has a normal mood and affect. Her behavior is normal.  Nursing note and vitals reviewed.   Urgent Care Course   Clinical Course    Procedures (including critical care time)  Labs Review Labs Reviewed  POCT URINALYSIS DIP (DEVICE)  CERVICOVAGINAL ANCILLARY ONLY    Imaging Review No results found.   Visual Acuity Review  Right Eye Distance:   Left Eye Distance:   Bilateral Distance:    Right Eye Near:   Left Eye Near:    Bilateral Near:         MDM   1. Bilateral low back pain without sciatica   2. PID (acute pelvic inflammatory disease)   3. Pelvic pain in female    The pain in your pelvis is caused by an infection. The specific type of infection is unknown at this time. You are being treated with to antibiotics today. It appeared testing comes back positive for other types of infection we will call you to you know and treat with other antibiotics if needed. Meds ordered this encounter  Medications  . azithromycin (ZITHROMAX) tablet 1,000 mg  . metroNIDAZOLE (FLAGYL) 500 MG tablet    Sig: Take 1 tablet (500 mg total) by mouth 2 (two) times daily. X 7 days    Dispense:  14 tablet    Refill:  0    Order Specific Question:   Supervising Provider    Answer:   Domenick GongMORTENSON, ASHLEY [[id number]]   Rocephin with inhaled this visit until we have testing results back. She is allergic to penicillin with hives, itching.    Hayden Rasmussenavid [place] Nabor, NP 02/03/16 1211    Hayden Rasmussenavid Brindy Higginbotham, NP 02/03/16 1213

## 2016-02-04 LAB — CERVICOVAGINAL ANCILLARY ONLY
Chlamydia: NEGATIVE
Neisseria Gonorrhea: NEGATIVE
Wet Prep (BD Affirm): POSITIVE — AB

## 2016-02-05 ENCOUNTER — Ambulatory Visit (HOSPITAL_COMMUNITY)
Admission: EM | Admit: 2016-02-05 | Discharge: 2016-02-05 | Disposition: A | Discharge status: Home / Self Care | Payer: BLUE CROSS/BLUE SHIELD | Attending: Family Medicine | Admitting: Family Medicine

## 2016-02-05 ENCOUNTER — Encounter (HOSPITAL_COMMUNITY): Payer: Self-pay | Admitting: *Deleted

## 2016-02-05 ENCOUNTER — Telehealth (HOSPITAL_COMMUNITY): Payer: Self-pay | Admitting: Emergency Medicine

## 2016-02-05 DIAGNOSIS — B373 Candidiasis of vulva and vagina: Secondary | ICD-10-CM | POA: Diagnosis not present

## 2016-02-05 DIAGNOSIS — B3731 Acute candidiasis of vulva and vagina: Secondary | ICD-10-CM

## 2016-02-05 MED ORDER — TERCONAZOLE 80 MG VA SUPP: 80.0000 mg | Freq: Every day | VAGINAL | 0 refills | Status: DC

## 2016-02-05 MED ORDER — FLUCONAZOLE 150 MG PO TABS: 150.0000 mg | ORAL_TABLET | Freq: Once | ORAL | 1 refills | Status: AC

## 2016-02-05 NOTE — Telephone Encounter (Signed)
Pt came in to [facility] today on 9/30.... Results given by Lincoln Brighamony B, RN  Diflucan given to pt by Dr. Artis FlockKindl on 9/30 visit.

## 2016-02-05 NOTE — Telephone Encounter (Signed)
-----   Message from Eustace MooreLaura W Murray, MD sent at 02/04/2016  3:10 PM EDT ----- Tests were positive for candida (yeast) and gardnerella (bacterial vaginosis).  Prescription for metronidazole was given at urgent care visit 02/03/16.  Prescription for fluconazole 150mg  po x 1 dose, repeat dose in 3d, #2 no refills, would be reasonable if symptoms of vaginal irritation/discharge are present. Recheck for further evaluation if symptoms persist.  Tests for gonorrhea/chlamydia are still pending. Ria ClockLaura Murray MD

## 2016-02-05 NOTE — ED Triage Notes (Signed)
Pt  Reports    Low  abd  Pain  And  Discharge   Seen  sev  Days  Ago  ucc  Was  Treated       Allergic      To   Penicillin

## 2016-02-05 NOTE — ED Provider Notes (Signed)
[facility]    CSN: [id number] Arrival date & time: 02/05/16  1543     History   Chief Complaint Chief Complaint  Patient presents with  . Vaginal Discharge    HPI Christy Little is a 25 y.o. female.   The history is provided by the patient and the spouse.  Vaginal Discharge  Quality:  White Severity:  Moderate Onset quality:  Gradual Progression:  Unchanged Chronicity:  New Context comment:  Seen 9/28 for same and rx'd for std butnot given yeast med, here for med. Associated symptoms: vaginal itching   Associated symptoms: no fever     Past Medical History:  Diagnosis Date  . Anxiety   . Constipation   . Gastritis, acute   . IBS (irritable bowel syndrome)   . Kidney stones 10/2011  . Sinus infection     There are no active problems to display for this patient.   No past surgical history on file.  OB History    No data available       Home Medications    Prior to Admission medications   Medication Sig Start Date End Date Taking? Authorizing Provider  dicyclomine (BENTYL) 10 MG capsule Take 1 capsule (10 mg total) by mouth as needed for spasms. 01/26/16   Hilarie Fredrickson, MD  metroNIDAZOLE (FLAGYL) 500 MG tablet Take 1 tablet (500 mg total) by mouth 2 (two) times daily. X 7 days 02/03/16   Hayden Rasmussen, NP    Family History Family History  Problem Relation Age of Onset  . Uterine cancer Mother     Social History Social History  Substance Use Topics  . Smoking status: Never Smoker  . Smokeless tobacco: Never Used  . Alcohol use Yes     Comment: Once every two weeks.      Allergies   Amoxicillin   Review of Systems Review of Systems  Constitutional: Negative.  Negative for fever.  Genitourinary: Positive for pelvic pain and vaginal discharge. Negative for vaginal bleeding.  All other systems reviewed and are negative.    Physical Exam Triage Vital Signs ED Triage Vitals [02/05/16 1620]  Enc Vitals Group     BP 146/92     Pulse  Rate 82     Resp 16     Temp 100.8 F (38.2 C)     Temp Source Oral     SpO2 99 %     Weight      Height      Head Circumference      Peak Flow      Pain Score      Pain Loc      Pain Edu?      Excl. in GC?    No data found.   Updated Vital Signs BP 146/92 (BP Location: Right Arm)   Pulse 82   Temp 100.8 F (38.2 C) (Oral)   Resp 16   LMP 01/29/2016   SpO2 99%   Visual Acuity Right Eye Distance:   Left Eye Distance:   Bilateral Distance:    Right Eye Near:   Left Eye Near:    Bilateral Near:     Physical Exam  Constitutional: She is oriented to person, place, and time. She appears well-developed and well-nourished.  Abdominal: Soft.  Neurological: She is alert and oriented to person, place, and time.  Nursing note and vitals reviewed.    UC Treatments / Results  Labs (all labs ordered are listed, but only abnormal  results are displayed) Labs Reviewed - No data to display  EKG  EKG Interpretation None       Radiology No results found.  Procedures Procedures (including critical care time)  Medications Ordered in UC Medications - No data to display   Initial Impression / Assessment and Plan / UC Course  I have reviewed the triage vital signs and the nursing notes.  Pertinent labs & imaging results that were available during my care of the patient were reviewed by me and considered in my medical decision making (see chart for details).  Clinical Course      Final Clinical Impressions(s) / UC Diagnoses   Final diagnoses:  None    New Prescriptions New Prescriptions   No medications on file     Linna HoffJames D Kindl, MD 02/05/16 1652

## 2016-02-09 ENCOUNTER — Emergency Department (HOSPITAL_COMMUNITY)
Admission: EM | Admit: 2016-02-09 | Discharge: 2016-02-09 | Disposition: A | Discharge status: Home / Self Care | Payer: BLUE CROSS/BLUE SHIELD | Attending: Emergency Medicine | Admitting: Emergency Medicine

## 2016-02-09 ENCOUNTER — Encounter (HOSPITAL_COMMUNITY): Payer: Self-pay | Admitting: *Deleted

## 2016-02-09 DIAGNOSIS — R112 Nausea with vomiting, unspecified: Secondary | ICD-10-CM | POA: Insufficient documentation

## 2016-02-09 DIAGNOSIS — Y829 Unspecified medical devices associated with adverse incidents: Secondary | ICD-10-CM | POA: Diagnosis not present

## 2016-02-09 DIAGNOSIS — N760 Acute vaginitis: Secondary | ICD-10-CM | POA: Insufficient documentation

## 2016-02-09 DIAGNOSIS — B9689 Other specified bacterial agents as the cause of diseases classified elsewhere: Secondary | ICD-10-CM | POA: Diagnosis not present

## 2016-02-09 DIAGNOSIS — T887XXA Unspecified adverse effect of drug or medicament, initial encounter: Secondary | ICD-10-CM | POA: Diagnosis not present

## 2016-02-09 DIAGNOSIS — N739 Female pelvic inflammatory disease, unspecified: Secondary | ICD-10-CM | POA: Diagnosis not present

## 2016-02-09 DIAGNOSIS — T373X5A Adverse effect of other antiprotozoal drugs, initial encounter: Secondary | ICD-10-CM | POA: Diagnosis not present

## 2016-02-09 DIAGNOSIS — N730 Acute parametritis and pelvic cellulitis: Secondary | ICD-10-CM

## 2016-02-09 DIAGNOSIS — N9489 Other specified conditions associated with female genital organs and menstrual cycle: Secondary | ICD-10-CM | POA: Insufficient documentation

## 2016-02-09 DIAGNOSIS — T50905A Adverse effect of unspecified drugs, medicaments and biological substances, initial encounter: Secondary | ICD-10-CM

## 2016-02-09 DIAGNOSIS — R1030 Lower abdominal pain, unspecified: Secondary | ICD-10-CM

## 2016-02-09 LAB — CBC
HCT: 46.8 % — ABNORMAL HIGH (ref 36.0–46.0)
Hemoglobin: 15.2 g/dL — ABNORMAL HIGH (ref 12.0–15.0)
MCH: 27.9 pg (ref 26.0–34.0)
MCHC: 32.5 g/dL (ref 30.0–36.0)
MCV: 86 fL (ref 78.0–100.0)
Platelets: 277 10*3/uL (ref 150–400)
RBC: 5.44 MIL/uL — ABNORMAL HIGH (ref 3.87–5.11)
RDW: 14 % (ref 11.5–15.5)
WBC: 8.5 10*3/uL (ref 4.0–10.5)

## 2016-02-09 LAB — COMPREHENSIVE METABOLIC PANEL
ALT: 21 U/L (ref 14–54)
AST: 28 U/L (ref 15–41)
Albumin: 4.7 g/dL (ref 3.5–5.0)
Alkaline Phosphatase: 60 U/L (ref 38–126)
Anion gap: 13 (ref 5–15)
BUN: 10 mg/dL (ref 6–20)
CO2: 18 mmol/L — ABNORMAL LOW (ref 22–32)
Calcium: 9.8 mg/dL (ref 8.9–10.3)
Chloride: 107 mmol/L (ref 101–111)
Creatinine, Ser: 0.93 mg/dL (ref 0.44–1.00)
GFR calc Af Amer: >60 mL/min (ref >60)
GFR calc non Af Amer: >60 mL/min (ref >60)
Glucose, Bld: 158 mg/dL — ABNORMAL HIGH (ref 65–99)
Potassium: 3.8 mmol/L (ref 3.5–5.1)
Sodium: 138 mmol/L (ref 135–145)
Total Bilirubin: 0.9 mg/dL (ref 0.3–1.2)
Total Protein: 7.6 g/dL (ref 6.5–8.1)

## 2016-02-09 LAB — URINALYSIS, ROUTINE W REFLEX MICROSCOPIC
Bilirubin Urine: NEGATIVE
Glucose, UA: NEGATIVE mg/dL
Hgb urine dipstick: NEGATIVE
Ketones, ur: 15 mg/dL — AB
Leukocytes, UA: NEGATIVE
Nitrite: NEGATIVE
Protein, ur: 30 mg/dL — AB
Specific Gravity, Urine: >1.046 — ABNORMAL HIGH (ref 1.005–1.030)
pH: 6.5 (ref 5.0–8.0)

## 2016-02-09 LAB — I-STAT BETA HCG BLOOD, ED (MC, WL, AP ONLY): I-stat hCG, quantitative: <5 m[IU]/mL (ref <5)

## 2016-02-09 LAB — URINE MICROSCOPIC-ADD ON

## 2016-02-09 LAB — LIPASE, BLOOD: Lipase: 21 U/L (ref 11–51)

## 2016-02-09 MED ORDER — ONDANSETRON 4 MG PO TBDP
8.0000 mg | ORAL_TABLET | Freq: Once | ORAL | Status: AC
  Administered 2016-02-09: 8 mg via ORAL
  Filled 2016-02-09: qty 2

## 2016-02-09 MED ORDER — ONDANSETRON 4 MG PO TBDP: 4.0000 mg | ORAL_TABLET | Freq: Three times a day (TID) | ORAL | 0 refills | Status: DC | PRN

## 2016-02-09 MED ORDER — DOXYCYCLINE HYCLATE 100 MG PO CAPS: 100.0000 mg | ORAL_CAPSULE | Freq: Two times a day (BID) | ORAL | 0 refills | Status: DC

## 2016-02-09 NOTE — ED Provider Notes (Signed)
[facility]-EMER[facility]ENCY DEPT Provider Note   CSN: [id number] Arrival date & time: 02/09/16  1631     History   Chief Complaint Chief Complaint  Patient presents with  . Vaginal Bleeding  . Emesis    HPI Christy Little is a 25 y.o. female with a PMHx of IBS, nephrolithiasis, constipation, anxiety, and gastritis, who presents to the ED with multiple complaints. Her primary complaint is nausea and vomiting that occurred after she drank alcohol last night while still being on Flagyl (taking for BV). She states that last night and this morning she had a total of 5-6 episodes of nonbloody nonbilious emesis, but she drank some Sprite and feels better now. She states that while she was vomiting, some of the vomit came through her nose and caused a mild nosebleed that has since stopped. She denies that the emesis itself was bloody (contrary to what was written on nursing notes).  Secondarily she complains of ongoing lower abdominal pain. She describes the pain as 5/10 intermittent throbbing pain radiating from the lower abdomen towards the lower back, worse with palpation and movement, with no specific treatments tried prior to arrival. She has been seen twice at urgent care in the last week. Chart review reveals she went to [facility] on 02/03/16 and again on 02/05/16 for vaginal discharge/lower abd and back pain, GC/CT testing was neg, wet prep revealed yeast and BV for which she was treated, still taking flagyl. She was also given azithromycin on 02/03/16 for ?PID, but no doxycycline was prescribed, and rocephin was held due to concern for possible allergic reaction given that she's allergic to amoxicillin. Patient states that since she started Flagyl, her abdominal pain has improved somewhat, and her vaginal discharge has also overall improved. This morning when she wiped she noticed some blood, but she's not sure whether it was from her vagina or from her rectum. She states it only happened once, only occurred with wiping,  was scant and bright red, and has since stopped. States there was no blood on the stool itself when she had a BM today. Of note, she states that she had a colonoscopy one week ago and was told that she has internal hemorrhoids, so she isn't sure if maybe that was what was causing the blood on the toilet paper.   She denies any fevers, chills, chest pain, shortness breath, ongoing epistaxis, ongoing nausea or vomiting, diarrhea, constipation, melena, ongoing hematochezia, hematemesis, rectal pain, vaginal itching, dysuria, hematuria, numbness, tingling, focal weakness, recent travel, suspicious food intake, sick contacts, chronic NSAID use, or prior abdominal surgeries.   The history is provided by the patient and medical records. No language interpreter was used.  Vaginal Bleeding  Primary symptoms include vaginal bleeding (unsure if from vagina vs from rectum).  Primary symptoms include no dysuria. Associated symptoms include abdominal pain, nausea and vomiting. Pertinent negatives include no constipation and no diarrhea.  Emesis   This is a new problem. The current episode started 12 to 24 hours ago. The problem occurs 5 to 10 times per day. The problem has been resolved. The emesis has an appearance of stomach contents. There has been no fever. Associated symptoms include abdominal pain. Pertinent negatives include no arthralgias, no chills, no diarrhea, no fever and no myalgias.    Past Medical History:  Diagnosis Date  . Anxiety   . Constipation   . Gastritis, acute   . IBS (irritable bowel syndrome)   . Kidney stones 10/2011  . Sinus infection  There are no active problems to display for this patient.   History reviewed. No pertinent surgical history.  OB History    No data available       Home Medications    Prior to Admission medications   Medication Sig Start Date End Date Taking? Authorizing Provider  dicyclomine (BENTYL) 10 MG capsule Take 1 capsule (10 mg total) by  mouth as needed for spasms. 01/26/16   Hilarie Fredrickson, MD  metroNIDAZOLE (FLAGYL) 500 MG tablet Take 1 tablet (500 mg total) by mouth 2 (two) times daily. X 7 days 02/03/16   Hayden Rasmussen, NP  terconazole (TERAZOL 3) 80 MG vaginal suppository Place 1 suppository (80 mg total) vaginally at bedtime. 02/05/16   Linna Hoff, MD    Family History Family History  Problem Relation Age of Onset  . Uterine cancer Mother     Social History Social History  Substance Use Topics  . Smoking status: Never Smoker  . Smokeless tobacco: Never Used  . Alcohol use Yes     Comment: Once every two weeks.      Allergies   Amoxicillin   Review of Systems Review of Systems  Constitutional: Negative for chills and fever.  HENT: Positive for nosebleeds (while vomiting this morning, none ongoing).   Respiratory: Negative for shortness of breath.   Cardiovascular: Negative for chest pain.  Gastrointestinal: Positive for abdominal pain, anal bleeding (unsure if from vagina vs from rectum), nausea and vomiting. Negative for blood in stool, constipation, diarrhea and rectal pain.  Genitourinary: Positive for vaginal bleeding (unsure if from vagina vs from rectum) and vaginal discharge (improving). Negative for dysuria and hematuria.  Musculoskeletal: Positive for back pain (radiating from abdomen). Negative for arthralgias and myalgias.  Skin: Negative for color change.  Allergic/Immunologic: Negative for immunocompromised state.  Neurological: Negative for weakness and numbness.  Psychiatric/Behavioral: Negative for confusion.   10 Systems reviewed and are negative for acute change except as noted in the HPI.   Physical Exam Updated Vital Signs BP 129/84 (BP Location: Left Arm)   Pulse 96   Temp 99.2 F (37.3 C) (Oral)   Resp 18   LMP 01/29/2016   SpO2 100%   Physical Exam  Constitutional: She is oriented to person, place, and time. Vital signs are normal. She appears well-developed and  well-nourished.  Non-toxic appearance. No distress.  Afebrile, nontoxic, NAD  HENT:  Head: Normocephalic and atraumatic.  Mouth/Throat: Oropharynx is clear and moist and mucous membranes are normal.  Eyes: Conjunctivae and EOM are normal. Right eye exhibits no discharge. Left eye exhibits no discharge.  Neck: Normal range of motion. Neck supple.  Cardiovascular: Normal rate, regular rhythm, normal heart sounds and intact distal pulses.  Exam reveals no gallop and no friction rub.   No murmur heard. Pulmonary/Chest: Effort normal and breath sounds normal. No respiratory distress. She has no decreased breath sounds. She has no wheezes. She has no rhonchi. She has no rales.  Abdominal: Soft. Normal appearance and bowel sounds are normal. She exhibits no distension. There is tenderness in the suprapubic area. There is no rigidity, no rebound, no guarding, no CVA tenderness, no tenderness at McBurney's point and negative Murphy's sign.    Soft, nondistended, +BS throughout, with very mild suprapubic TTP, no r/g/r, neg murphy's, neg mcburney's, no CVA TTP   Genitourinary:  Genitourinary Comments: Deferred due to recent pelvic exam and recent colonoscopy  Musculoskeletal: Normal range of motion.  Neurological: She is alert and oriented  to person, place, and time. She has normal strength. No sensory deficit.  Skin: Skin is warm, dry and intact. No rash noted.  Psychiatric: She has a normal mood and affect.  Nursing note and vitals reviewed.    ED Treatments / Results  Labs (all labs ordered are listed, but only abnormal results are displayed) Labs Reviewed  COMPREHENSIVE METABOLIC PANEL - Abnormal; Notable for the following:       Result Value   CO2 18 (*)    Glucose, Bld 158 (*)    All other components within normal limits  CBC - Abnormal; Notable for the following:    RBC 5.44 (*)    Hemoglobin 15.2 (*)    HCT 46.8 (*)    All other components within normal limits  URINALYSIS, ROUTINE  W REFLEX MICROSCOPIC (NOT AT [facility]) - Abnormal; Notable for the following:    Color, Urine AMBER (*)    Specific Gravity, Urine >1.046 (*)    Ketones, ur 15 (*)    Protein, ur 30 (*)    All other components within normal limits  URINE MICROSCOPIC-ADD ON - Abnormal; Notable for the following:    Squamous Epithelial / LPF 6-30 (*)    Bacteria, UA FEW (*)    All other components within normal limits  LIPASE, BLOOD  I-STAT BETA HCG BLOOD, ED ([facility], [facility], [facility] ONLY)   GC/CT test 02/03/16: negative Wet prep 02/03/16: +gardnerella vaginalis, +candida    EKG  EKG Interpretation None       Radiology No results found.  Procedures Procedures (including critical care time)  Medications Ordered in ED Medications  ondansetron (ZOFRAN-ODT) disintegrating tablet 8 mg (8 mg Oral Given 02/09/16 1905)     Initial Impression / Assessment and Plan / ED Course  I have reviewed the triage vital signs and the nursing notes.  Pertinent labs & imaging results that were available during my care of the patient were reviewed by me and considered in my medical decision making (see chart for details).  Clinical Course    25 y.o. female here with multiple complaints; primarily is n/v after drinking EtOH last night, still on flagyl. Had some epistaxis during emesis, but this resolved; no hematemesis, which was reported in triage note.   Secondarily is ongoing lower abd pain, has been seen 2x in the last 1wk for same, [facility] and had pelvic exam done that revealed CMT and adnexal tenderness, diagnosed her with PID, gave azithro by didn't give rocephin due to concern for allergy, and didn't rx doxycycline; sent home with flagyl. Went back on 02/05/16 was told her wet prep had yeast and BV, so given the suppository for that and continued on flagyl. Still having some pain, although improved, and discharge has improved. When she wiped this morning, she had scant red blood on paper, not sure if it was vaginal or  rectal. Recently had colonoscopy that revealed internal hemorrhoids. She states the bleeding happened once and stopped.   Doubt need for rectal exam since she just had C-scope, and bleeding has resolved. Doubt need for pelvic since she just had one. GC/CT testing neg at urgent care, doubt need to repeat this. Labs done in triage show: Istat beta HCG neg. U/A with some ketones and protein likely related to mild dehydration, nitrite and leuk neg, 6-30 squamous, few bacteria, 0-5 WBC and RBCs; likely contaminated sample. Lipase WNL. CMP with mildly elevated gluc 158, could be due to stress demargination vs dehydration,etc. CBC with  mildly elevated H/H likely from hemoconcentration, no leukocytosis. On exam, abd with very minimal TTP in suprapubic region, nonperitoneal, no flank pain; overall well appearing.   Overall it seems like: 1) n/v is from drinking EtOH while on flagyl. Discussed why this is not a smart decision and she should NOT drink EtOH while on flagyl, but she DOES need to continue taking it. And 2) abd pain could be PID, clinically, especially given the pelvic exam that was documented the other day at [facility]-- will proceed with tx for this, will rx doxycycline x2 wks. Discussed f/up with OBGYN in 1wk for ongoing management. Stay hydrated. Tylenol/motrin for pain. Rx zofran given. Pt tolerating PO well here, even prior to zofran administration. Strict return precautions discussed. I explained the diagnosis and have given explicit precautions to return to the ER including for any other new or worsening symptoms. The patient understands and accepts the medical plan as it's been dictated and I have answered their questions. Discharge instructions concerning home care and prescriptions have been given. The patient is STABLE and is discharged to home in good condition.   Final Clinical Impressions(s) / ED Diagnoses   Final diagnoses:  Lower abdominal pain  PID (acute pelvic inflammatory disease)    Nausea and vomiting in adult patient  Medication reaction, initial encounter  BV (bacterial vaginosis)    New Prescriptions New Prescriptions   DOXYCYCLINE (VIBRAMYCIN) 100 MG CAPSULE    Take 1 capsule (100 mg total) by mouth 2 (two) times daily. One po bid x 14 days   ONDANSETRON (ZOFRAN ODT) 4 MG DISINTEGRATING TABLET    Take 1 tablet (4 mg total) by mouth every 8 (eight) hours as needed for nausea or vomiting.     Kiersten Coss Camprubi-Soms, PA-C 02/09/16 1918    Jacalyn Lefevre, MD 02/09/16 2201

## 2016-02-09 NOTE — Discharge Instructions (Signed)
Your symptoms could be related to Pelvic Inflammatory Disease (PID). Continue taking your flagyl as directed by the previous provider, but DO NOT DRINK ALCOHOL WHILE TAKING THAT MEDICATION! Start taking doxycycline to treat PID, avoid sun exposure while taking this medication. Stay well hydrated. Use tylenol and motrin as needed for pain. Use zofran as directed as needed for nausea. Follow up with the OBGYN in 1 week for recheck of symptoms and ongoing management of your pelvic pain. [facility] (similar to the ER) for changes or worsening symptoms.   Abdominal (belly) pain can be caused by many things. Your caregiver performed an examination and possibly ordered blood/urine tests and imaging (CT scan, x-rays, ultrasound). Many cases can be observed and treated at home after initial evaluation in the emergency department. Even though you are being discharged home, abdominal pain can be unpredictable. Therefore, you need a repeated exam if your pain does not resolve, returns, or worsens. Most patients with abdominal pain don't have to be admitted to the hospital or have surgery, but serious problems like appendicitis and gallbladder attacks can start out as nonspecific pain. Many abdominal conditions cannot be diagnosed in one visit, so follow-up evaluations are very important. SEEK IMMEDIATE MEDICAL ATTENTION IF YOU DEVELOP ANY OF THE FOLLOWING SYMPTOMS: The pain does not go away or becomes severe.  A temperature above 101 develops.  Repeated vomiting occurs (multiple episodes).  The pain becomes localized to portions of the abdomen. The right side could possibly be appendicitis. In an adult, the left lower portion of the abdomen could be colitis or diverticulitis.  Blood is being passed in stools or vomit (bright red or black tarry stools).  Return also if you develop chest pain, difficulty breathing, dizziness or fainting, or become confused, poorly responsive, or inconsolable (young  children). The constipation stays for more than 4 days.  There is belly (abdominal) or rectal pain.  You do not seem to be getting better.

## 2016-02-09 NOTE — ED Triage Notes (Signed)
Pt has multiple complaints. [facility] for PID and yeast infection. Reports having rash, still has vaginal symptoms, and also having n/v today after etoh use last night. Reports this am was vomiting blood and had abnormal vaginal bleeding. No distress noted at triage.

## 2016-02-12 ENCOUNTER — Encounter (HOSPITAL_COMMUNITY): Payer: Self-pay | Admitting: Emergency Medicine

## 2016-02-12 ENCOUNTER — Ambulatory Visit (HOSPITAL_COMMUNITY)
Admission: EM | Admit: 2016-02-12 | Discharge: 2016-02-12 | Disposition: A | Discharge status: Home / Self Care | Payer: BLUE CROSS/BLUE SHIELD | Attending: Internal Medicine | Admitting: Internal Medicine

## 2016-02-12 DIAGNOSIS — B001 Herpesviral vesicular dermatitis: Secondary | ICD-10-CM

## 2016-02-12 MED ORDER — PENCICLOVIR 1 % EX CREA: 1.0000 "application " | TOPICAL_CREAM | CUTANEOUS | 0 refills | Status: DC

## 2016-02-12 NOTE — ED Triage Notes (Signed)
Rash to lower right lip.  Initially little bumps and itching, symptoms now include a burning pain.  No history of this

## 2016-02-12 NOTE — ED Triage Notes (Signed)
Rash on lip for one week

## 2016-02-12 NOTE — ED Provider Notes (Signed)
CSN: [id number]     Arrival date & time 02/12/16  1204 History   First MD Initiated Contact with Patient 02/12/16 1403     Chief Complaint  Patient presents with  . Rash   (Consider location/radiation/quality/duration/timing/severity/associated sxs/prior Treatment) 25 year old female states that approximately 9 days ago she developed a sore on the right lower lip. It was a crusty bumpy lesion that initially was itching and then later burning. She has been applying OTC medications and it appears as though she has been picking at it as there is some recent dried blood. No other lesions. No systemic symptoms.      Past Medical History:  Diagnosis Date  . Anxiety   . Constipation   . Gastritis, acute   . IBS (irritable bowel syndrome)   . Kidney stones 10/2011  . Sinus infection    History reviewed. No pertinent surgical history. Family History  Problem Relation Age of Onset  . Uterine cancer Mother    Social History  Substance Use Topics  . Smoking status: Never Smoker  . Smokeless tobacco: Never Used  . Alcohol use Yes     Comment: Once every two weeks.    OB History    No data available     Review of Systems  Constitutional: Negative.   HENT: Negative for congestion, ear pain, sinus pressure and voice change.   Eyes: Negative.   Respiratory: Negative.   All other systems reviewed and are negative.   Allergies  Amoxicillin  Home Medications   Prior to Admission medications   Medication Sig Start Date End Date Taking? Authorizing Provider  doxycycline (VIBRAMYCIN) 100 MG capsule Take 1 capsule (100 mg total) by mouth 2 (two) times daily. One po bid x 14 days 02/09/16  Yes Mercedes Camprubi-Soms, PA-C  metroNIDAZOLE (FLAGYL) 500 MG tablet Take 1 tablet (500 mg total) by mouth 2 (two) times daily. X 7 days 02/03/16   Hayden Rasmussenavid Renly Guedes, NP  ondansetron (ZOFRAN ODT) 4 MG disintegrating tablet Take 1 tablet (4 mg total) by mouth every 8 (eight) hours as needed for nausea or  vomiting. 02/09/16   Mercedes Camprubi-Soms, PA-C  penciclovir (DENAVIR) 1 % cream Apply 1 application topically every 2 (two) hours. 02/12/16   Hayden Rasmussenavid Antonius Hartlage, NP  terconazole (TERAZOL 3) 80 MG vaginal suppository Place 1 suppository (80 mg total) vaginally at bedtime. Patient not taking: Reported on 02/12/2016 02/05/16   Linna HoffJames D Kindl, MD   Meds Ordered and Administered this Visit  Medications - No data to display  BP 139/84 (BP Location: Right Arm)   Pulse 87   Temp 98.9 F (37.2 C) (Oral)   Resp 16   LMP 01/29/2016  No data found.   Physical Exam  Constitutional: She is oriented to person, place, and time. She appears well-developed and well-nourished. No distress.  HENT:  Head: Normocephalic and atraumatic.  Right Ear: External ear normal.  Left Ear: External ear normal.  Lesion on the lower lip right side is ovoid in shape measuring approximately 6-7 mm in length. There is some dried bloody crusty tissue in the middle of the lesion which appears to been "picked" away. Underwent education the periphery of the lesion is irregular shaped with few small vesicles. No evidence of bacterial infection. No abnormalities beyond the well marginated borders. No swelling of the lip.  Eyes: Conjunctivae and EOM are normal.  Neck: Neck supple.  Cardiovascular: Normal rate.   Pulmonary/Chest: Effort normal.  Neurological: She is alert and oriented to person,  place, and time.  Skin: Skin is warm and dry. Capillary refill takes less than 2 seconds.  Psychiatric: She has a normal mood and affect.  Nursing note and vitals reviewed.   Urgent Care Course   Clinical Course    Procedures (including critical care time)  Labs Review Labs Reviewed - No data to display  Imaging Review No results found.   Visual Acuity Review  Right Eye Distance:   Left Eye Distance:   Bilateral Distance:    Right Eye Near:   Left Eye Near:    Bilateral Near:         MDM   1. Cold sore    Written  instructions on cold sores. Do not touch or scratch or pick at it. Keep it clean. Wash hands frequently. Use the cream as directed. Meds ordered this encounter  Medications  . penciclovir (DENAVIR) 1 % cream    Sig: Apply 1 application topically every 2 (two) hours.    Dispense:  1.5 g    Refill:  0    Order Specific Question:   Supervising Provider    Answer:   Eustace Moore [[id number]]       Hayden Rasmussen, NP 02/12/16 [phone]

## 2016-03-27 ENCOUNTER — Ambulatory Visit: Payer: BLUE CROSS/BLUE SHIELD | Admitting: Internal Medicine

## 2016-04-17 ENCOUNTER — Ambulatory Visit (INDEPENDENT_AMBULATORY_CARE_PROVIDER_SITE_OTHER): Payer: BLUE CROSS/BLUE SHIELD | Admitting: *Deleted

## 2016-04-17 VITALS — BP 137/60 | HR 97 | Wt 132.5 lb

## 2016-04-17 DIAGNOSIS — Z3042 Encounter for surveillance of injectable contraceptive: Secondary | ICD-10-CM | POA: Diagnosis not present

## 2016-04-17 MED ORDER — MEDROXYPROGESTERONE ACETATE 150 MG/ML IM SUSP
150.0000 mg | Freq: Once | INTRAMUSCULAR | Status: AC
  Administered 2016-04-17: 150 mg via INTRAMUSCULAR

## 2016-04-17 NOTE — Progress Notes (Signed)
Depo Provera 150 mg given IM as scheduled - pt tolerated well.  Next injection due 2/26-3/12/18.  Pt advised that she will need Annual Gyn exam with next injection. She voiced understanding.

## 2016-07-03 ENCOUNTER — Encounter: Payer: Self-pay | Admitting: Obstetrics and Gynecology

## 2016-07-03 ENCOUNTER — Ambulatory Visit (INDEPENDENT_AMBULATORY_CARE_PROVIDER_SITE_OTHER): Payer: BLUE CROSS/BLUE SHIELD | Admitting: Obstetrics and Gynecology

## 2016-07-03 ENCOUNTER — Ambulatory Visit (INDEPENDENT_AMBULATORY_CARE_PROVIDER_SITE_OTHER): Payer: Self-pay | Admitting: Clinical

## 2016-07-03 VITALS — BP 130/96 | HR 103 | Wt 130.5 lb

## 2016-07-03 DIAGNOSIS — Z01419 Encounter for gynecological examination (general) (routine) without abnormal findings: Secondary | ICD-10-CM

## 2016-07-03 DIAGNOSIS — F411 Generalized anxiety disorder: Secondary | ICD-10-CM

## 2016-07-03 DIAGNOSIS — N898 Other specified noninflammatory disorders of vagina: Secondary | ICD-10-CM | POA: Diagnosis not present

## 2016-07-03 DIAGNOSIS — I10 Essential (primary) hypertension: Secondary | ICD-10-CM | POA: Insufficient documentation

## 2016-07-03 DIAGNOSIS — F419 Anxiety disorder, unspecified: Secondary | ICD-10-CM

## 2016-07-03 DIAGNOSIS — F329 Major depressive disorder, single episode, unspecified: Secondary | ICD-10-CM

## 2016-07-03 DIAGNOSIS — Z0000 Encounter for general adult medical examination without abnormal findings: Secondary | ICD-10-CM

## 2016-07-03 DIAGNOSIS — F32A Depression, unspecified: Secondary | ICD-10-CM

## 2016-07-03 NOTE — Progress Notes (Signed)
Obstetrics and Gynecology Visit Patient Annual Evaluation  Appointment Date: 07/04/2016  OBGYN Clinic: [facility]  Primary Care Provider: No PCP Per Patient  Chief Complaint: Annual exam, vaginal bump, contraception management  History of Present Illness: Christy Little is a 26 y.o. African-American G0 (Patient's last menstrual period was 06/28/2016 (exact date).), seen for the above chief complaint. Her past medical history is significant for anxiety, depression, HTN, irritable BS.  GYN: patient notes vaginal bump (high in the vault,? Near apex). 1st noticed it last week when she was having some cramping. No prior h/o this and no s/s associated with it. Patient on depo provera and has a little bit of cramping and bleeding when she's due for another shot. Last depo shot 12/11.   HTN: pt never has been told she has HTN  Psych: patient notes increased anxiety and depression but unable to identify any specific causes such as financial, social stressors. She's a Consulting civil engineer at [employer]   No breast s/s, fevers, chills, chest pain, SOB, nausea, vomiting, abdominal pain, dysuria, hematuria, vaginal itching, dyspareunia, change in BMs, chest pain, SOB  Review of Systems: as noted in the History of Present Illness.  Past Medical History:  Past Medical History:  Diagnosis Date  . Anxiety   . Constipation   . Gastritis, acute   . History of trichomoniasis   . Hypertension   . IBS (irritable bowel syndrome)   . Kidney stones 10/2011  . Sinus infection     Past Surgical History:  No past surgical history on file.  Past Obstetrical History:  OB History  Gravida Para Term Preterm AB Living  0 0 0 0 0 0  SAB TAB Ectopic Multiple Live Births  0 0 0 0 0        Past Gynecological History: As per HPI. Pap history: 09/2014 NILM History of STIs: none Contraception: depo provera  Social History:  Social History   Social History  . Marital status: Single    Spouse name:  N/A  . Number of children: 0  . Years of education: N/A   Occupational History  . student    Social History Main Topics  . Smoking status: Never Smoker  . Smokeless tobacco: Never Used  . Alcohol use Yes     Comment: Once every two weeks.   . Drug use: Yes    Frequency: 1.0 time per week    Types: Marijuana     Comment: Everyday.   Marland Kitchen Sexual activity: Not on file   Other Topics Concern  . Not on file   Social History Narrative  . No narrative on file    Family History:  Family History  Problem Relation Age of Onset  . Uterine cancer Mother   ? Uterine cancer in mother   Medications None  Allergies Amoxicillin   Physical Exam:  BP (!) 130/96   Pulse (!) 103   Wt 130 lb 8 oz (59.2 kg)   LMP 06/28/2016 (Exact Date)   BMI 23.87 kg/m  Body mass index is 23.87 kg/m. General appearance: Well nourished, well developed female in no acute distress.  Neck:  Supple, normal appearance, and no thyromegaly  Cardiovascular: normal s1 and s2.  No murmurs, rubs or gallops. Respiratory:  Clear to auscultation bilateral. Normal respiratory effort Abdomen: positive bowel sounds and no masses, hernias; diffusely non tender to palpation, non distended Breasts: breasts appear normal, no suspicious masses, no skin or nipple changes or axillary nodes, and palpation normal.  Neuro/Psych:  Normal mood and affect.  Skin:  Warm and dry.  Lymphatic:  No inguinal lymphadenopathy.   Pelvic exam: is not limited by body habitus EGBUS: within normal limits, Vagina: within normal limits and with no blood or discharge in the vault, 1cm nttp, mobile fluctuant bump at right apex of the vagina.  Cervix: normal appearing cervix without tenderness, discharge or lesions. Uterus:  nonenlarged and non tender and Adnexa:  normal adnexa and no mass, fullness, tenderness Rectovaginal: deferred  Laboratory: none  Radiology:   Assessment: 06/2015 u/s for AUB negative.   Plan: pt stable 1. Well woman  exam Normal exam. GC/CT/trich testing done today. Pt declines serum STI testing (negative hiv, rpr 03/2015). Exp management for bump and pt told to let us know if ever has +s/s.  - Cervicovaginal ancillary only  2. Anxiety and depression Patient in a relationship and boyfriend in room and patient okay with having him stay during entire h&p. She contracts for safety and has seen student health services and a therapist in the community; she has never been on medications. Pt amenable to meeting with Hulda MarinJamie McMannes today and d/w her that most pressing issue with her today is this and needs close follow up with appropriate providers.   3. Hypertension, unspecified type Pt believes could be related to anxiety; her flowsheets show mild range BPs for past several years. D/w her to stop smoking (told her to use birthday as quit date) and eliminate any alcohol intake and can follow up in one year.   RTC PRN  Cornelia Copaharlie Torris House, Jr MD Attending [facility](Faculty Practice)

## 2016-07-03 NOTE — BH Specialist Note (Signed)
Session Start time: 2:00   End Time: 2:30 Total Time:  30 minutes Type of Service: Behavioral Health - Individual/Family Interpreter: No.   Interpreter Name & Language: n/a # [facility] July 2017-June 2018: 1st  SUBJECTIVE: Christy Little is a 26 y.o. female  Pt. was referred by Dr Vergie LivingPickens for:  anxiety and depression. Pt. reports the following symptoms/concerns: Pt states that she has experienced anxiety and depression "for years", along with panic attacks and nightmares interfering with sleep; copes by deep breathing until anxiety decreases; concerned about family history of BH issues. Duration of problem:  Over two years Severity: moderate(depressive)/ severe(anxious) Previous treatment: none  OBJECTIVE: Mood: Anxious & Affect: Appropriate Risk of harm to self or others: No known risk of harm to self or others Assessments administered: PHQ9: 15/ GAD7: 20  LIFE CONTEXT:  Family & Social: Family, boyfriend and friends supportive; sister has history of bipolar disorder  School/ Work: -  Self-Care: Deep breathing exercises for panic and anxiety  Life changes: no recent changes What is important to pt/family (values): Overall wellbeing  GOALS ADDRESSED:  -Reduce symptoms of anxiety and depression  INTERVENTIONS: Solution Focused   ASSESSMENT:  Pt currently experiencing Generalized anxiety disorder.  Pt may benefit from psychoeducation and brief therapeutic intervention regarding coping with symptoms of anxiety and depression.   PLAN: 1. F/U with behavioral health clinician: At next visit, two months, or earlier, as needed 2. Behavioral Health meds: none 3. Behavioral recommendations:  -Practice CALM relaxation breathing exercise daily, with morning multivitamin, at at bedtime -Consider notepad at bedside to set aside worries of the day, prior to sleep, along with sleep sound app, to focus on sound, rather than daily worries -Consider additional calming apps, as additional  self-coping strategies -Read educational material regarding coping with symptoms of anxiety and depression 4. Referral: Brief Counseling/Psychotherapy and Psychoeducation 5. From scale of 1-10, how likely are you to follow plan: 9  Woc-Behavioral Health Clinician  Behavioral Health Clinician  Christy PelWarmhandoff:   Warm Hand Off Completed.

## 2016-07-04 ENCOUNTER — Encounter: Payer: Self-pay | Admitting: Obstetrics and Gynecology

## 2016-07-04 LAB — CERVICOVAGINAL ANCILLARY ONLY
Bacterial vaginitis: POSITIVE — AB
Candida vaginitis: NEGATIVE
Chlamydia: NEGATIVE
Neisseria Gonorrhea: NEGATIVE
Trichomonas: NEGATIVE

## 2016-07-07 ENCOUNTER — Other Ambulatory Visit: Payer: Self-pay | Admitting: Obstetrics and Gynecology

## 2016-07-07 MED ORDER — METRONIDAZOLE 500 MG PO TABS: 500.0000 mg | ORAL_TABLET | Freq: Two times a day (BID) | ORAL | 0 refills | Status: AC

## 2016-07-16 ENCOUNTER — Encounter: Payer: Self-pay | Admitting: Obstetrics and Gynecology

## 2016-07-16 DIAGNOSIS — T3695XA Adverse effect of unspecified systemic antibiotic, initial encounter: Principal | ICD-10-CM

## 2016-07-16 DIAGNOSIS — B379 Candidiasis, unspecified: Secondary | ICD-10-CM

## 2016-07-17 MED ORDER — FLUCONAZOLE 150 MG PO TABS: 150.0000 mg | ORAL_TABLET | Freq: Once | ORAL | 0 refills | Status: AC

## 2016-07-31 ENCOUNTER — Encounter: Payer: Self-pay | Admitting: Obstetrics and Gynecology

## 2016-08-01 ENCOUNTER — Ambulatory Visit: Payer: Self-pay

## 2016-09-18 ENCOUNTER — Ambulatory Visit: Payer: Self-pay

## 2016-09-27 ENCOUNTER — Inpatient Hospital Stay (HOSPITAL_COMMUNITY)
Admission: AD | Admit: 2016-09-27 | Discharge: 2016-09-27 | Disposition: A | Discharge status: Home / Self Care | Payer: BLUE CROSS/BLUE SHIELD | Source: Ambulatory Visit | Attending: Obstetrics and Gynecology | Admitting: Obstetrics and Gynecology

## 2016-09-27 ENCOUNTER — Encounter (HOSPITAL_COMMUNITY): Payer: Self-pay

## 2016-09-27 DIAGNOSIS — A599 Trichomoniasis, unspecified: Secondary | ICD-10-CM | POA: Diagnosis not present

## 2016-09-27 DIAGNOSIS — Z8719 Personal history of other diseases of the digestive system: Secondary | ICD-10-CM | POA: Insufficient documentation

## 2016-09-27 DIAGNOSIS — R21 Rash and other nonspecific skin eruption: Secondary | ICD-10-CM | POA: Insufficient documentation

## 2016-09-27 DIAGNOSIS — Z79899 Other long term (current) drug therapy: Secondary | ICD-10-CM | POA: Insufficient documentation

## 2016-09-27 DIAGNOSIS — M545 Low back pain: Secondary | ICD-10-CM | POA: Insufficient documentation

## 2016-09-27 DIAGNOSIS — Z87442 Personal history of urinary calculi: Secondary | ICD-10-CM | POA: Insufficient documentation

## 2016-09-27 DIAGNOSIS — Z9889 Other specified postprocedural states: Secondary | ICD-10-CM | POA: Insufficient documentation

## 2016-09-27 DIAGNOSIS — I10 Essential (primary) hypertension: Secondary | ICD-10-CM | POA: Insufficient documentation

## 2016-09-27 DIAGNOSIS — Z888 Allergy status to other drugs, medicaments and biological substances status: Secondary | ICD-10-CM | POA: Insufficient documentation

## 2016-09-27 DIAGNOSIS — F419 Anxiety disorder, unspecified: Secondary | ICD-10-CM | POA: Insufficient documentation

## 2016-09-27 DIAGNOSIS — Z880 Allergy status to penicillin: Secondary | ICD-10-CM | POA: Insufficient documentation

## 2016-09-27 DIAGNOSIS — R102 Pelvic and perineal pain: Secondary | ICD-10-CM | POA: Insufficient documentation

## 2016-09-27 DIAGNOSIS — K589 Irritable bowel syndrome without diarrhea: Secondary | ICD-10-CM | POA: Insufficient documentation

## 2016-09-27 DIAGNOSIS — N898 Other specified noninflammatory disorders of vagina: Secondary | ICD-10-CM | POA: Insufficient documentation

## 2016-09-27 HISTORY — DX: Herpesviral infection, unspecified: B00.9

## 2016-09-27 LAB — URINALYSIS, ROUTINE W REFLEX MICROSCOPIC
Bilirubin Urine: NEGATIVE
Glucose, UA: NEGATIVE mg/dL
Hgb urine dipstick: NEGATIVE
Ketones, ur: 20 mg/dL — AB
Leukocytes, UA: NEGATIVE
Nitrite: NEGATIVE
Protein, ur: NEGATIVE mg/dL
Specific Gravity, Urine: 1.016 (ref 1.005–1.030)
pH: 6 (ref 5.0–8.0)

## 2016-09-27 LAB — WET PREP, GENITAL
Clue Cells Wet Prep HPF POC: NONE SEEN
Sperm: NONE SEEN
Trich, Wet Prep: NONE SEEN
Yeast Wet Prep HPF POC: NONE SEEN

## 2016-09-27 LAB — POCT PREGNANCY, URINE: Preg Test, Ur: NEGATIVE

## 2016-09-27 MED ORDER — NAPROXEN 500 MG PO TABS: 500.0000 mg | ORAL_TABLET | Freq: Two times a day (BID) | ORAL | 0 refills | Status: DC

## 2016-09-27 MED ORDER — NAPROXEN 250 MG PO TABS
500.0000 mg | ORAL_TABLET | Freq: Once | ORAL | Status: AC
  Administered 2016-09-27: 500 mg via ORAL
  Filled 2016-09-27: qty 2

## 2016-09-27 NOTE — Discharge Instructions (Signed)

## 2016-09-27 NOTE — MAU Note (Addendum)
Pt states she went to urgent care on the 16th and was diagnosed with BV and trich. Pt states she had an allergic reaction to the Flagyl that she was prescribed. Pt states she called urgent care and they told her to come to the hospital. Pt c/o pelvic pain that started about a week ago and got worse today. Pt c/o pain starting at her pelvic area and down into her thighs that started today. Pt states she had a normal period on 09/20/16. Pt denies bleeding currently. Pt states she was at an art show over the weekend and was yelling a lot. Pt states she started loosing her voice yesterday and has had a cough since yesterday.

## 2016-09-27 NOTE — MAU Provider Note (Signed)
History     CSN: [id number]  Arrival date and time: 09/27/16 2039   First Provider Initiated Contact with Patient 09/27/16 2119      Chief Complaint  Patient presents with  . Pelvic Pain   Christy Little is a 26 y.o. G0P0000 who presents today with lower back pain, pelvic pain and upper thigh pain x 7 week. She was seen at [facility] on 5/16 and was dx with trichomoniasis. She states that they called in flagyl for her, and she cannot take flagyl. She reports that when she takes flagyl she gets a rash around her mouth that itches.    Pelvic Pain  The patient's primary symptoms include pelvic pain. The patient's pertinent negatives include no vaginal discharge. This is a new problem. The current episode started in the past 7 days. The problem occurs intermittently. The problem has been gradually worsening. Pain severity now: 6/10. The problem affects both sides. She is not pregnant. Associated symptoms include back pain and nausea. Pertinent negatives include no chills, constipation, diarrhea, dysuria, fever, frequency, urgency or vomiting. Associated symptoms comments: Leg pain . The vaginal discharge was normal. There has been no bleeding. Nothing aggravates the symptoms. She has tried nothing for the symptoms. She is sexually active. She uses nothing for contraception. Her menstrual history has been regular (LMP 09/20/16 ).    Past Medical History:  Diagnosis Date  . Anxiety   . Constipation   . Gastritis, acute   . History of trichomoniasis   . HSV infection    pt states she started having cold sores in October  . Hypertension   . IBS (irritable bowel syndrome)   . Kidney stones 10/2011  . Sinus infection     Past Surgical History:  Procedure Laterality Date  . COLONOSCOPY      Family History  Problem Relation Age of Onset  . Uterine cancer Mother     Social History  Substance Use Topics  . Smoking status: Never Smoker  . Smokeless tobacco: Never Used  . Alcohol use Yes      Comment: Once every two weeks.     Allergies:  Allergies  Allergen Reactions  . Amoxicillin Itching and Rash    Has patient had a PCN reaction causing immediate rash, facial/tongue/throat swelling, SOB or lightheadedness with hypotension: no Has patient had a PCN reaction causing severe rash involving mucus membranes or skin necrosis: pt had skin rash/irritation  Has patient had a PCN reaction that required hospitalization: no  Has patient had a PCN reaction occurring within the last 10 years: yes occurred 2013  If all of the above answers are "NO", then may proceed with Cephalosporin use.   . Flagyl [Metronidazole] Itching and Rash    Facility-Administered Medications Prior to Admission  Medication Dose Route Frequency Provider Last Rate Last Dose  . 0.9 %  sodium chloride infusion  500 mL Intravenous Continuous Hilarie Fredrickson, MD       Prescriptions Prior to Admission  Medication Sig Dispense Refill Last Dose  . doxycycline (VIBRAMYCIN) 100 MG capsule Take 1 capsule (100 mg total) by mouth 2 (two) times daily. One po bid x 14 days (Patient not taking: Reported on 07/03/2016) 28 capsule 0 Not Taking  . metroNIDAZOLE (FLAGYL) 500 MG tablet Take 1 tablet (500 mg total) by mouth 2 (two) times daily. X 7 days (Patient not taking: Reported on 07/03/2016) 14 tablet 0 Not Taking  . ondansetron (ZOFRAN ODT) 4 MG disintegrating tablet Take 1 tablet (  4 mg total) by mouth every 8 (eight) hours as needed for nausea or vomiting. (Patient not taking: Reported on 07/03/2016) 15 tablet 0 Not Taking  . penciclovir (DENAVIR) 1 % cream Apply 1 application topically every 2 (two) hours. (Patient not taking: Reported on 07/03/2016) 1.5 g 0 Not Taking  . terconazole (TERAZOL 3) 80 MG vaginal suppository Place 1 suppository (80 mg total) vaginally at bedtime. (Patient not taking: Reported on 02/12/2016) 3 suppository 0 Not Taking    Review of Systems  Constitutional: Negative for chills and fever.   Gastrointestinal: Positive for nausea. Negative for constipation, diarrhea and vomiting.  Genitourinary: Positive for pelvic pain. Negative for dysuria, frequency, urgency, vaginal bleeding and vaginal discharge.  Musculoskeletal: Positive for back pain.   Physical Exam   Blood pressure 121/77, pulse 99, temperature 99.3 F (37.4 C), temperature source Oral, resp. rate 18, height 5' 2.25" (1.581 m), weight 127 lb 8 oz (57.8 kg), last menstrual period 09/20/2016, SpO2 99 %.  Physical Exam  Nursing note and vitals reviewed. Constitutional: She is oriented to person, place, and time. She appears well-developed and well-nourished. No distress.  HENT:  Head: Normocephalic.  Cardiovascular: Normal rate.   Respiratory: Effort normal.  GI: Soft. There is no tenderness. There is no rebound.  Genitourinary:  Genitourinary Comments: No CVA tenderness   Musculoskeletal: Normal range of motion. She exhibits tenderness.  Neurological: She is alert and oriented to person, place, and time.  Skin: Skin is warm and dry.  Psychiatric: She has a normal mood and affect.   Results for orders placed or performed during the hospital encounter of 09/27/16 (from the past 24 hour(s))  Urinalysis, Routine w reflex microscopic     Status: Abnormal   Collection Time: 09/27/16  8:45 PM  Result Value Ref Range   Color, Urine YELLOW YELLOW   APPearance HAZY (A) CLEAR   Specific Gravity, Urine 1.016 1.005 - 1.030   pH 6.0 5.0 - 8.0   Glucose, UA NEGATIVE NEGATIVE mg/dL   Hgb urine dipstick NEGATIVE NEGATIVE   Bilirubin Urine NEGATIVE NEGATIVE   Ketones, ur 20 (A) NEGATIVE mg/dL   Protein, ur NEGATIVE NEGATIVE mg/dL   Nitrite NEGATIVE NEGATIVE   Leukocytes, UA NEGATIVE NEGATIVE  Pregnancy, urine POC     Status: None   Collection Time: 09/27/16  9:00 PM  Result Value Ref Range   Preg Test, Ur NEGATIVE NEGATIVE  Wet prep, genital     Status: Abnormal   Collection Time: 09/27/16  9:30 PM  Result Value Ref  Range   Yeast Wet Prep HPF POC NONE SEEN NONE SEEN   Trich, Wet Prep NONE SEEN NONE SEEN   Clue Cells Wet Prep HPF POC NONE SEEN NONE SEEN   WBC, Wet Prep HPF POC FEW (A) NONE SEEN   Sperm NONE SEEN     MAU Course  Procedures  MDM D/W the patient that per [facility] the 5-nitromidazole drugs are the only know medications to treat trichomoniasis, and that with an allergy it is recommended that the patient undergo desensitization. There are some other medications that provide less than 50% cure rates and are not recommended at this time. Patient does not have a PCP. She will go to the health department for treatment.    Patient given naproxen here, and reports that her pain is better at this time.    Assessment and Plan   1. Trichomoniasis    DC home Comfort measures reviewed  RX: naproxen 500mg  BID PRN #  30  Return to MAU as needed FU with OB as planned  Follow-up Information    [facility]   Contact information: [address] [place] [id number] [phone]            Thressa ShellerHeather Erionna Little 09/27/2016, 9:20 PM

## 2016-09-28 LAB — GC/CHLAMYDIA PROBE AMP (~~LOC~~) NOT AT ARMC
Chlamydia: NEGATIVE
Neisseria Gonorrhea: NEGATIVE

## 2016-10-24 ENCOUNTER — Telehealth: Payer: Self-pay | Admitting: Obstetrics & Gynecology

## 2016-10-24 NOTE — Telephone Encounter (Signed)
Called pt and discussed her test results (wet prep) from 1 month ago. She voiced understanding and expressed gratitude for the return call.

## 2016-10-24 NOTE — Telephone Encounter (Signed)
Patient stated she had questions about some results she saw on her MyChart. She stated she called on yesterday, and has not heard back from a nurse.

## 2018-03-28 ENCOUNTER — Emergency Department (HOSPITAL_COMMUNITY): Payer: BLUE CROSS/BLUE SHIELD

## 2018-03-28 ENCOUNTER — Emergency Department (HOSPITAL_COMMUNITY)
Admission: EM | Admit: 2018-03-28 | Discharge: 2018-03-28 | Disposition: A | Discharge status: Home / Self Care | Payer: BLUE CROSS/BLUE SHIELD | Attending: Emergency Medicine | Admitting: Emergency Medicine

## 2018-03-28 ENCOUNTER — Encounter (HOSPITAL_COMMUNITY): Payer: Self-pay

## 2018-03-28 DIAGNOSIS — Z79899 Other long term (current) drug therapy: Secondary | ICD-10-CM | POA: Diagnosis not present

## 2018-03-28 DIAGNOSIS — R05 Cough: Secondary | ICD-10-CM | POA: Diagnosis present

## 2018-03-28 DIAGNOSIS — I10 Essential (primary) hypertension: Secondary | ICD-10-CM | POA: Insufficient documentation

## 2018-03-28 DIAGNOSIS — J069 Acute upper respiratory infection, unspecified: Secondary | ICD-10-CM | POA: Diagnosis not present

## 2018-03-28 MED ORDER — ALBUTEROL SULFATE HFA 108 (90 BASE) MCG/ACT IN AERS
2.0000 | INHALATION_SPRAY | RESPIRATORY_TRACT | Status: DC | PRN
  Administered 2018-03-28: 2 via RESPIRATORY_TRACT
  Filled 2018-03-28: qty 6.7

## 2018-03-28 MED ORDER — BENZONATATE 100 MG PO CAPS: 200.0000 mg | ORAL_CAPSULE | Freq: Two times a day (BID) | ORAL | 0 refills | Status: DC | PRN

## 2018-03-28 MED ORDER — ALBUTEROL SULFATE (2.5 MG/3ML) 0.083% IN NEBU
5.0000 mg | INHALATION_SOLUTION | Freq: Once | RESPIRATORY_TRACT | Status: AC
  Administered 2018-03-28: 5 mg via RESPIRATORY_TRACT
  Filled 2018-03-28: qty 6

## 2018-03-28 MED ORDER — CETIRIZINE HCL 10 MG PO TABS: 10.0000 mg | ORAL_TABLET | Freq: Every day | ORAL | 0 refills | Status: DC

## 2018-03-28 NOTE — ED Provider Notes (Signed)
[facility] EMERGENCY DEPARTMENT Provider Note   CSN: [id number] Arrival date & time: 03/28/18  2201     History   Chief Complaint Chief Complaint  Patient presents with  . Cough  . Shortness of Breath    HPI Christy Little is a 27 y.o. female.  Patient presents to the emergency department with a chief complaint of cough and shortness of breath.  She reports having had symptoms for the past 4 days.  She denies any fevers or chills.  She states that she has had some sore throat, but this is improving.  Denies any nasal congestion or rhinorrhea.  She states that she has not had a productive cough, but that it has been dry.  She states that it feels like her chest is tight.  She has tried taking over-the-counter cough and cold medicine with mild relief.  She denies any other associated symptoms.  Denies any history of asthma, PE, or DVT.  The history is provided by the patient. No language interpreter was used.    Past Medical History:  Diagnosis Date  . Anxiety   . Constipation   . Gastritis, acute   . History of trichomoniasis   . HSV infection    pt states she started having cold sores in October  . Hypertension   . IBS (irritable bowel syndrome)   . Kidney stones 10/2011  . Sinus infection     Patient Active Problem List   Diagnosis Date Noted  . Hypertension 07/03/2016  . Anxiety and depression 07/03/2016    Past Surgical History:  Procedure Laterality Date  . COLONOSCOPY       OB History    Gravida  0   Para  0   Term  0   Preterm  0   AB  0   Living  0     SAB  0   TAB  0   Ectopic  0   Multiple  0   Live Births  0            Home Medications    Prior to Admission medications   Medication Sig Start Date End Date Taking? Authorizing Provider  doxycycline (VIBRAMYCIN) 100 MG capsule Take 1 capsule (100 mg total) by mouth 2 (two) times daily. One po bid x 14 days Patient not taking: Reported on 07/03/2016 02/09/16    Street, [place], PA-C  metroNIDAZOLE (FLAGYL) 500 MG tablet Take 1 tablet (500 mg total) by mouth 2 (two) times daily. X 7 days Patient not taking: Reported on 07/03/2016 02/03/16   Hayden Rasmussen, NP  naproxen (NAPROSYN) 500 MG tablet Take 1 tablet (500 mg total) by mouth 2 (two) times daily with a meal. 09/27/16   Thressa Sheller D, CNM  ondansetron (ZOFRAN ODT) 4 MG disintegrating tablet Take 1 tablet (4 mg total) by mouth every 8 (eight) hours as needed for nausea or vomiting. Patient not taking: Reported on 07/03/2016 02/09/16   Street, [place], PA-C  penciclovir [facility]) 1 % cream Apply 1 application topically every 2 (two) hours. Patient not taking: Reported on 07/03/2016 02/12/16   Hayden Rasmussen, NP  terconazole (TERAZOL 3) 80 MG vaginal suppository Place 1 suppository (80 mg total) vaginally at bedtime. Patient not taking: Reported on 02/12/2016 02/05/16   Linna Hoff, MD    Family History Family History  Problem Relation Age of Onset  . Uterine cancer Mother     Social History Social History   Tobacco Use  .  Smoking status: Never Smoker  . Smokeless tobacco: Never Used  Substance Use Topics  . Alcohol use: Yes    Comment: Once every two weeks.   . Drug use: Yes    Frequency: 1.0 times per week    Types: Marijuana    Comment: Everyday.      Allergies   Amoxicillin and Flagyl [metronidazole]   Review of Systems Review of Systems  All other systems reviewed and are negative.    Physical Exam Updated Vital Signs BP 138/76   Pulse (!) 103   Temp 99.4 F (37.4 C)   Resp 16   Ht 5\' 2"  (1.575 m)   Wt 49.4 kg   LMP 03/08/2018   SpO2 99%   BMI 19.94 kg/m   Physical Exam  Constitutional: She is oriented to person, place, and time. She appears well-developed and well-nourished.  HENT:  Head: Normocephalic and atraumatic.  Eyes: Pupils are equal, round, and reactive to light. Conjunctivae and EOM are normal.  Neck: Normal range of motion. Neck supple.    Cardiovascular: Normal rate and regular rhythm. Exam reveals no gallop and no friction rub.  No murmur heard. Pulmonary/Chest: Effort normal. No respiratory distress. She has wheezes. She has no rales. She exhibits no tenderness.  Left upper lobe wheezing  Abdominal: Soft. Bowel sounds are normal. She exhibits no distension and no mass. There is no tenderness. There is no rebound and no guarding.  Musculoskeletal: Normal range of motion. She exhibits no edema or tenderness.  Neurological: She is alert and oriented to person, place, and time.  Skin: Skin is warm and dry.  Psychiatric: She has a normal mood and affect. Her behavior is normal. Judgment and thought content normal.  Nursing note and vitals reviewed.    ED Treatments / Results  Labs (all labs ordered are listed, but only abnormal results are displayed) Labs Reviewed - No data to display  EKG None  Radiology No results found.  Procedures Procedures (including critical care time)  Medications Ordered in ED Medications  albuterol (PROVENTIL) (2.5 MG/3ML) 0.083% nebulizer solution 5 mg (has no administration in time range)     Initial Impression / Assessment and Plan / ED Course  I have reviewed the triage vital signs and the nursing notes.  Pertinent labs & imaging results that were available during my care of the patient were reviewed by me and considered in my medical decision making (see chart for details).     Patient with cough and cold symptoms.  Has mild left upper lobe wheezing.  Will give breathing treatment.  Will check chest x-ray.  Patient tachycardic following breathing treatment.  Chest x-ray is negative.  States that the breathing treatment helped quite a bit.  Discharge home with inhaler and cough medicine.  Final Clinical Impressions(s) / ED Diagnoses   Final diagnoses:  Upper respiratory tract infection, unspecified type    ED Discharge Orders         Ordered    benzonatate (TESSALON)  100 MG capsule  2 times daily PRN     03/28/18 2331    cetirizine (ZYRTEC ALLERGY) 10 MG tablet  Daily     03/28/18 2331           Roxy HorsemanBrowning, Janda Cargo, PA-C 03/28/18 2344    Gerhard MunchLockwood, Shira Bobst, MD 03/29/18 0002

## 2018-03-28 NOTE — ED Triage Notes (Signed)
Pt states that on Monday started with a sore throat and has progressed to coughing. Nasal congestion and chest tightness. Pt has been taking OTC medication to help with the symptoms nothing has been helping her at this time.

## 2018-03-28 NOTE — ED Notes (Signed)
E-signature not available, pt verbalized understanding of DC instructions and prescriptions 

## 2018-11-30 ENCOUNTER — Other Ambulatory Visit: Payer: Self-pay

## 2018-11-30 ENCOUNTER — Encounter (HOSPITAL_COMMUNITY): Payer: Self-pay | Admitting: Emergency Medicine

## 2018-11-30 ENCOUNTER — Emergency Department (HOSPITAL_COMMUNITY)
Admission: EM | Admit: 2018-11-30 | Discharge: 2018-12-01 | Disposition: A | Discharge status: Home / Self Care | Payer: BC Managed Care – PPO | Attending: Emergency Medicine | Admitting: Emergency Medicine

## 2018-11-30 DIAGNOSIS — Z79899 Other long term (current) drug therapy: Secondary | ICD-10-CM | POA: Insufficient documentation

## 2018-11-30 DIAGNOSIS — I10 Essential (primary) hypertension: Secondary | ICD-10-CM | POA: Diagnosis not present

## 2018-11-30 DIAGNOSIS — R062 Wheezing: Secondary | ICD-10-CM | POA: Diagnosis present

## 2018-11-30 DIAGNOSIS — J9801 Acute bronchospasm: Secondary | ICD-10-CM | POA: Diagnosis not present

## 2018-11-30 HISTORY — DX: Unspecified asthma, uncomplicated: J45.909

## 2018-11-30 MED ORDER — ALBUTEROL SULFATE HFA 108 (90 BASE) MCG/ACT IN AERS
2.0000 | INHALATION_SPRAY | Freq: Once | RESPIRATORY_TRACT | Status: AC
  Administered 2018-11-30: 2 via RESPIRATORY_TRACT
  Filled 2018-11-30: qty 6.7

## 2018-11-30 NOTE — ED Triage Notes (Signed)
Pt here for eval of asthma exacerbation that started today. States she has been out of her inhaler for a while.

## 2018-11-30 NOTE — ED Notes (Signed)
Pt original triage BP taken with incorrect BP cuff size, too large, and outside of jean jacket. BP rechecked w appropriate cuff. Pt responded well to inhaler, no longer anxious. States she feels much better.

## 2018-12-01 MED ORDER — ALBUTEROL SULFATE HFA 108 (90 BASE) MCG/ACT IN AERS: 1.0000 | INHALATION_SPRAY | Freq: Four times a day (QID) | RESPIRATORY_TRACT | 0 refills | Status: DC | PRN

## 2018-12-01 MED ORDER — DEXAMETHASONE 4 MG PO TABS
10.0000 mg | ORAL_TABLET | Freq: Once | ORAL | Status: AC
  Administered 2018-12-01: 03:00:00 10 mg via ORAL
  Filled 2018-12-01: qty 1

## 2018-12-01 NOTE — ED Notes (Signed)
ED Provider at bedside. 

## 2018-12-01 NOTE — ED Provider Notes (Signed)
[facility] Provider Note   CSN: [id number] Arrival date & time: 11/30/18  2220    History   Chief Complaint Chief Complaint  Patient presents with  . Asthma    HPI Christy Little is a 28 y.o. female.     28 year old female with a history of postnasal drip and bronchospasm presents to the emergency department for evaluation of chest tightness and wheezing.  She states that this is usually relieved with use of an albuterol inhaler, but ran out of her inhaler and did not have any other medications to use.  Does take daily allergy medication.  Woke up tonight with symptom onset.  Received 8 puffs of an albuterol inhaler in triage with symptom resolution.  She states that she is currently breathing at baseline.  No associated sick contacts, fever  The history is provided by the patient. No language interpreter was used.  Asthma    Past Medical History:  Diagnosis Date  . Anxiety   . Asthma   . Constipation   . Gastritis, acute   . History of trichomoniasis   . HSV infection    pt states she started having cold sores in October  . Hypertension   . IBS (irritable bowel syndrome)   . Kidney stones 10/2011  . Sinus infection     Patient Active Problem List   Diagnosis Date Noted  . Hypertension 07/03/2016  . Anxiety and depression 07/03/2016    Past Surgical History:  Procedure Laterality Date  . COLONOSCOPY       OB History    Gravida  0   Para  0   Term  0   Preterm  0   AB  0   Living  0     SAB  0   TAB  0   Ectopic  0   Multiple  0   Live Births  0            Home Medications    Prior to Admission medications   Medication Sig Start Date End Date Taking? Authorizing Provider  albuterol (VENTOLIN HFA) 108 (90 Base) MCG/ACT inhaler Inhale 1-2 puffs into the lungs every 6 (six) hours as needed for wheezing or shortness of breath. 12/01/18   Antony MaduraHumes, Estefani Bateson, PA-C  benzonatate (TESSALON) 100 MG capsule Take  2 capsules (200 mg total) by mouth 2 (two) times daily as needed for cough. 03/28/18   Roxy HorsemanBrowning, Robert, PA-C  cetirizine (ZYRTEC ALLERGY) 10 MG tablet Take 1 tablet (10 mg total) by mouth daily. 03/28/18   Roxy HorsemanBrowning, Robert, PA-C  doxycycline (VIBRAMYCIN) 100 MG capsule Take 1 capsule (100 mg total) by mouth 2 (two) times daily. One po bid x 14 days Patient not taking: Reported on 07/03/2016 02/09/16   Street, [place], PA-C  metroNIDAZOLE (FLAGYL) 500 MG tablet Take 1 tablet (500 mg total) by mouth 2 (two) times daily. X 7 days Patient not taking: Reported on 07/03/2016 02/03/16   Hayden RasmussenMabe, David, NP  naproxen (NAPROSYN) 500 MG tablet Take 1 tablet (500 mg total) by mouth 2 (two) times daily with a meal. 09/27/16   Thressa ShellerHogan, Heather D, CNM  ondansetron (ZOFRAN ODT) 4 MG disintegrating tablet Take 1 tablet (4 mg total) by mouth every 8 (eight) hours as needed for nausea or vomiting. Patient not taking: Reported on 07/03/2016 02/09/16   Street, [place], PA-C  penciclovir [facility](DENAVIR) 1 % cream Apply 1 application topically every 2 (two) hours. Patient not taking: Reported on 07/03/2016  02/12/16   Hayden RasmussenMabe, David, NP  terconazole (TERAZOL 3) 80 MG vaginal suppository Place 1 suppository (80 mg total) vaginally at bedtime. Patient not taking: Reported on 02/12/2016 02/05/16   Linna HoffKindl, James D, MD    Family History Family History  Problem Relation Age of Onset  . Uterine cancer Mother     Social History Social History   Tobacco Use  . Smoking status: Never Smoker  . Smokeless tobacco: Never Used  Substance Use Topics  . Alcohol use: Yes    Comment: Once every two weeks.   . Drug use: Yes    Frequency: 1.0 times per week    Types: Marijuana    Comment: Everyday.      Allergies   Amoxicillin and Flagyl [metronidazole]   Review of Systems Review of Systems Ten systems reviewed and are negative for acute change, except as noted in the HPI.    Physical Exam Updated Vital Signs BP (!) 125/92 (BP  Location: Left Arm)   Pulse 90   Temp 98.3 F (36.8 C) (Oral)   Resp 18   SpO2 98%   Physical Exam Vitals signs and nursing note reviewed.  Constitutional:      General: She is not in acute distress.    Appearance: She is well-developed. She is not diaphoretic.     Comments: Nontoxic appearing and in NAD  HENT:     Head: Normocephalic and atraumatic.  Eyes:     General: No scleral icterus.    Conjunctiva/sclera: Conjunctivae normal.  Neck:     Musculoskeletal: Normal range of motion.  Cardiovascular:     Rate and Rhythm: Normal rate and regular rhythm.     Pulses: Normal pulses.  Pulmonary:     Effort: Pulmonary effort is normal. No respiratory distress.     Breath sounds: No stridor. Wheezing present. No rhonchi.     Comments: Faint expiratory wheeze in the right mid lung fields as well as the left lower lung fields.  Patient without signs of respiratory distress.  States that she is breathing at baseline. Musculoskeletal: Normal range of motion.  Skin:    General: Skin is warm and dry.     Coloration: Skin is not pale.     Findings: No erythema or rash.  Neurological:     General: No focal deficit present.     Mental Status: She is alert and oriented to person, place, and time.     Coordination: Coordination normal.     Comments: GCS 15.  Moving all extremities spontaneously.  Psychiatric:        Behavior: Behavior normal.      ED Treatments / Results  Labs (all labs ordered are listed, but only abnormal results are displayed) Labs Reviewed - No data to display  EKG None  Radiology No results found.  Procedures Procedures (including critical care time)  Medications Ordered in ED Medications  dexamethasone (DECADRON) tablet 10 mg (has no administration in time range)  albuterol (VENTOLIN HFA) 108 (90 Base) MCG/ACT inhaler 2 puff (2 puffs Inhalation Given 11/30/18 2231)     Initial Impression / Assessment and Plan / ED Course  I have reviewed the triage  vital signs and the nursing notes.  Pertinent labs & imaging results that were available during my care of the patient were reviewed by me and considered in my medical decision making (see chart for details).        28 year old female presents for acute bronchospasm which improved following albuterol  in triage.  She has faint expiratory wheeze, but states that she is breathing at baseline.  No hypoxia or signs of respiratory distress.  Denies any sick contacts or associated fever.  Will continue with outpatient use of albuterol as needed.  Return precautions discussed and provided. Patient discharged in stable condition with no unaddressed concerns.   Final Clinical Impressions(s) / ED Diagnoses   Final diagnoses:  Acute bronchospasm    ED Discharge Orders         Ordered    albuterol (VENTOLIN HFA) 108 (90 Base) MCG/ACT inhaler  Every 6 hours PRN     12/01/18 0222           Antonietta Breach, PA-C 12/01/18 0246    Ward, Delice Bison, DO 12/01/18 [phone]

## 2018-12-01 NOTE — Discharge Instructions (Addendum)
Use 1 to 2 puffs of an albuterol inhaler every 4-6 hours as needed for management of persistent wheezing, chest tightness, shortness of breath.  Also continue use of daily Zyrtec or Claritin for management of allergies.  Follow-up with your primary care doctor.

## 2019-12-18 ENCOUNTER — Other Ambulatory Visit: Payer: Self-pay

## 2019-12-18 ENCOUNTER — Encounter (HOSPITAL_COMMUNITY): Payer: Self-pay

## 2019-12-18 ENCOUNTER — Emergency Department (HOSPITAL_COMMUNITY)
Admission: EM | Admit: 2019-12-18 | Discharge: 2019-12-18 | Disposition: A | Discharge status: Home / Self Care | Payer: BC Managed Care – PPO | Attending: Emergency Medicine | Admitting: Emergency Medicine

## 2019-12-18 DIAGNOSIS — Z7951 Long term (current) use of inhaled steroids: Secondary | ICD-10-CM | POA: Insufficient documentation

## 2019-12-18 DIAGNOSIS — I10 Essential (primary) hypertension: Secondary | ICD-10-CM | POA: Insufficient documentation

## 2019-12-18 DIAGNOSIS — N939 Abnormal uterine and vaginal bleeding, unspecified: Secondary | ICD-10-CM

## 2019-12-18 DIAGNOSIS — J45909 Unspecified asthma, uncomplicated: Secondary | ICD-10-CM | POA: Insufficient documentation

## 2019-12-18 LAB — WET PREP, GENITAL
Clue Cells Wet Prep HPF POC: NONE SEEN
Sperm: NONE SEEN
Trich, Wet Prep: NONE SEEN
Yeast Wet Prep HPF POC: NONE SEEN

## 2019-12-18 LAB — I-STAT BETA HCG BLOOD, ED (MC, WL, AP ONLY): I-stat hCG, quantitative: <5 m[IU]/mL (ref <5)

## 2019-12-18 MED ORDER — NAPROXEN 500 MG PO TABS: 500.0000 mg | ORAL_TABLET | Freq: Two times a day (BID) | ORAL | 0 refills | Status: DC

## 2019-12-18 NOTE — Discharge Instructions (Addendum)
Please call OB/GYN for ongoing evaluation and management.  Oral contraceptives would be helpful in regulating your menses.  While there is no urgent/emergent pelvic ultrasound warranted as I have low suspicion for structural abnormalities, you may benefit from If your bleeding symptoms continue to persist.  Return to the ED or seek immediate medical attention should you experience any new or worsening symptoms.

## 2019-12-18 NOTE — ED Triage Notes (Signed)
Pt arrives to ED w/ c/o irregular periods. Pt states blood is a different consistency than normal.

## 2019-12-18 NOTE — ED Notes (Signed)
Patient verbalizes understanding of discharge instructions. Opportunity for questioning and answers were provided. Armband removed by staff, pt discharged from ED to home 

## 2019-12-18 NOTE — ED Provider Notes (Signed)
[facility] EMERGENCY DEPARTMENT Provider Note   CSN: [id number] Arrival date & time: 12/18/19  1115     History Chief Complaint  Patient presents with  . Vaginal Bleeding    Christy Little is a 29 y.o. female with PMH significant for GC, HSV-2, and PID presents the ED with complaints of "different bleeding" with her current menses.  Patient states that she was supposed to begin her menses this week and it began, as expected.  She had her typical prodromal symptoms and mild abdominal cramping, but nothing out of the ordinary.  However, she states that her bleeding is "different" and is not clotting as it usually does.  She does not take oral contraceptives regularly.  She does not see an OB/GYN.  She had sexual intercourse every day last week and there was no dyspareunia.  She is in a committed relationship with 1 partner and does not have any concern for STI at this time.  She denies any abnormal vaginal discharge or unusual cramping symptoms.  She simply came to the ED today to get checked out because of her unusual bleeding.  She is only using 1 pad a day and denies any significant bleeding symptoms.  HPI     Past Medical History:  Diagnosis Date  . Anxiety   . Asthma   . Constipation   . Gastritis, acute   . History of trichomoniasis   . HSV infection    pt states she started having cold sores in October  . Hypertension   . IBS (irritable bowel syndrome)   . Kidney stones 10/2011  . Sinus infection     Patient Active Problem List   Diagnosis Date Noted  . Hypertension 07/03/2016  . Anxiety and depression 07/03/2016    Past Surgical History:  Procedure Laterality Date  . COLONOSCOPY       OB History    Gravida  0   Para  0   Term  0   Preterm  0   AB  0   Living  0     SAB  0   TAB  0   Ectopic  0   Multiple  0   Live Births  0           Family History  Problem Relation Age of Onset  . Uterine cancer Mother     Social  History   Tobacco Use  . Smoking status: Never Smoker  . Smokeless tobacco: Never Used  Substance Use Topics  . Alcohol use: Yes    Comment: Once every two weeks.   . Drug use: Yes    Frequency: 1.0 times per week    Types: Marijuana    Comment: Everyday.     Home Medications Prior to Admission medications   Medication Sig Start Date End Date Taking? Authorizing Provider  albuterol (VENTOLIN HFA) 108 (90 Base) MCG/ACT inhaler Inhale 1-2 puffs into the lungs every 6 (six) hours as needed for wheezing or shortness of breath. 12/01/18   Antony Madura, PA-C  benzonatate (TESSALON) 100 MG capsule Take 2 capsules (200 mg total) by mouth 2 (two) times daily as needed for cough. 03/28/18   Roxy Horseman, PA-C  cetirizine (ZYRTEC ALLERGY) 10 MG tablet Take 1 tablet (10 mg total) by mouth daily. 03/28/18   Roxy Horseman, PA-C  doxycycline (VIBRAMYCIN) 100 MG capsule Take 1 capsule (100 mg total) by mouth 2 (two) times daily. One po bid x 14 days Patient not  taking: Reported on 07/03/2016 02/09/16   Street, [place], PA-C  metroNIDAZOLE (FLAGYL) 500 MG tablet Take 1 tablet (500 mg total) by mouth 2 (two) times daily. X 7 days Patient not taking: Reported on 07/03/2016 02/03/16   Hayden Rasmussen, NP  naproxen (NAPROSYN) 500 MG tablet Take 1 tablet (500 mg total) by mouth 2 (two) times daily with a meal. 09/27/16   Thressa Sheller D, CNM  ondansetron (ZOFRAN ODT) 4 MG disintegrating tablet Take 1 tablet (4 mg total) by mouth every 8 (eight) hours as needed for nausea or vomiting. Patient not taking: Reported on 07/03/2016 02/09/16   Street, [place], PA-C  penciclovir [facility]) 1 % cream Apply 1 application topically every 2 (two) hours. Patient not taking: Reported on 07/03/2016 02/12/16   Hayden Rasmussen, NP  terconazole (TERAZOL 3) 80 MG vaginal suppository Place 1 suppository (80 mg total) vaginally at bedtime. Patient not taking: Reported on 02/12/2016 02/05/16   Linna Hoff, MD    Allergies      Amoxicillin and Flagyl [metronidazole]  Review of Systems   Review of Systems  All other systems reviewed and are negative.   Physical Exam Updated Vital Signs BP 124/87 (BP Location: Right Arm)   Pulse 73   Temp 98.8 F (37.1 C) (Oral)   Resp 16   Ht 5\' 10"  (1.778 m)   Wt 52.2 kg   SpO2 100%   BMI 16.50 kg/m   Physical Exam Vitals and nursing note reviewed. Exam conducted with a chaperone present.  Constitutional:      Appearance: Normal appearance.  HENT:     Head: Normocephalic and atraumatic.  Eyes:     General: No scleral icterus.    Conjunctiva/sclera: Conjunctivae normal.  Cardiovascular:     Rate and Rhythm: Normal rate and regular rhythm.     Pulses: Normal pulses.     Heart sounds: Normal heart sounds.  Pulmonary:     Effort: Pulmonary effort is normal. No respiratory distress.     Breath sounds: Normal breath sounds.  Abdominal:     General: Abdomen is flat. There is no distension.     Palpations: Abdomen is soft.     Tenderness: There is no abdominal tenderness. There is no guarding.     Comments: Cannot appreciate enlarged uterus.  Suprapubic area is nontender.  Genitourinary:    Comments: External: Normal external genitalia. Speculum exam: Significant amount of blood in vaginal vault.  Cervix is visualized and does not appear friable.  Bleeding noted from cervical os.  No obvious wall lacerations appreciated. Bimanual exam: No CMT or significant adnexal TTP.  No obvious masses. Musculoskeletal:     Cervical back: Normal range of motion and neck supple. No rigidity.  Skin:    General: Skin is dry.  Neurological:     Mental Status: She is alert.     GCS: GCS eye subscore is 4. GCS verbal subscore is 5. GCS motor subscore is 6.  Psychiatric:        Mood and Affect: Mood normal.        Behavior: Behavior normal.        Thought Content: Thought content normal.     ED Results / Procedures / Treatments   Labs (all labs ordered are listed, but only  abnormal results are displayed) Labs Reviewed  WET PREP, GENITAL - Abnormal; Notable for the following components:      Result Value   WBC, Wet Prep HPF POC FEW (*)  All other components within normal limits  I-STAT BETA HCG BLOOD, ED ([facility], [facility], [facility] ONLY)  GC/CHLAMYDIA PROBE AMP ([place]) NOT AT [facility]    EKG None  Radiology No results found.  Procedures Procedures (including critical care time)  Medications Ordered in ED Medications - No data to display  ED Course  I have reviewed the triage vital signs and the nursing notes.  Pertinent labs & imaging results that were available during my care of the patient were reviewed by me and considered in my medical decision making (see chart for details).    MDM Rules/Calculators/A&P                          The most common cause of abnormal vaginal bleeding is to be uterine bleeding related to ovulatory dysfunction.  Patient denies any significant postcoital pain or bleeding concerning for traumatic laceration.  No abnormal bruising, bleeding, or family history of coagulopathies.  No petechiae on physical exam and do not feel as though PT/INR or clotting work-up is warranted at this time.  Given the lack of significant bleeding, I do not feel as though CBC is necessary to evaluate for anemia.  Vital signs are reassuring and she is hemodynamically stable.    Given brevity of bleeding and discomfort in conjunction with her reassuring physical exam and laboratory work-up, do not feel as though emergent pelvic US is warranted.  I have low suspicion for structural abnormalities as cause of her "different bleeding".  No peritoneal signs or large uterus appreciated on exam.  Cervix nonfriable.  No vaginal lacerations.  Wet prep unremarkable.  Patient has low suspicion for GC/STI and will not treat empirically.  We will recommend naproxen 500 mg twice daily as NSAIDs have been shown to decrease cramping and bleeding in the context of menses.   Low suspicion for anemia at this time I do not feel as though over-the-counter ferrous sulfate is warranted.  I discussed with patient that oral contraceptives and other hormonal medications can help regulate her cycle.  Encouraged her to get established with an OB/GYN.  If she begins to develop any significant bleeding irregularity, she may ultimately require D&C, however she must first at least get established with OB/GYN.  If symptoms fail to improve despite conservative management, consider TSH outpatient to evaluate for thyroid hormone imbalance as contributing factor.    Strict ED return precautions discussed.  All of the evaluation and work-up results were discussed with the patient and any family at bedside. They were provided opportunity to ask any additional questions and have none at this time. They have expressed understanding of verbal discharge instructions as well as return precautions and are agreeable to the plan.    Final Clinical Impression(s) / ED Diagnoses Final diagnoses:  Vaginal bleeding    Rx / DC Orders ED Discharge Orders    None       Lorelee New, PA-C 12/18/19 1625    Margarita Grizzle, MD 12/19/19 [phone]

## 2019-12-19 LAB — GC/CHLAMYDIA PROBE AMP (~~LOC~~) NOT AT ARMC
Chlamydia: NEGATIVE
Comment: NEGATIVE
Comment: NORMAL
Neisseria Gonorrhea: NEGATIVE

## 2020-02-11 ENCOUNTER — Telehealth: Payer: Self-pay

## 2020-02-11 NOTE — Telephone Encounter (Signed)
Patient left message requesting return call regarding scheduling a mammogram. Left message on voicemail requesting return call.  

## 2020-02-24 ENCOUNTER — Other Ambulatory Visit: Payer: Self-pay

## 2020-02-24 DIAGNOSIS — N63 Unspecified lump in unspecified breast: Secondary | ICD-10-CM

## 2020-03-03 ENCOUNTER — Ambulatory Visit: Payer: Self-pay | Admitting: *Deleted

## 2020-03-03 ENCOUNTER — Other Ambulatory Visit: Payer: Self-pay

## 2020-03-03 VITALS — BP 110/74 | Temp 97.4°F | Wt 119.2 lb

## 2020-03-03 DIAGNOSIS — N6315 Unspecified lump in the right breast, overlapping quadrants: Secondary | ICD-10-CM

## 2020-03-03 DIAGNOSIS — Z1239 Encounter for other screening for malignant neoplasm of breast: Secondary | ICD-10-CM

## 2020-03-03 DIAGNOSIS — N644 Mastodynia: Secondary | ICD-10-CM

## 2020-03-03 NOTE — Progress Notes (Signed)
[name] is a 29 y.o. female who presents to [facility] clinic today with complaint of right breast lump x 2-4 weeks and pain. Patient states the pain comes and goes. Patient rates the pain at a 8 out of 10.    Pap Smear: Pap smear not completed today. Last Pap smear was in 2018 or 2019 and was normal. Per patient has no history of an abnormal Pap smear. Last Pap smear result is not available in Epic. Previous Pap smear 09/25/2014 is available in Epic and normal.   Physical exam: Breasts Breasts symmetrical. No skin abnormalities bilateral breasts. No nipple retraction bilateral breasts. No nipple discharge bilateral breasts. No lymphadenopathy. No lumps palpated left breast. Palpated a bb sized lump within the right breast at 12 o'clock 7 cm from the nipple. Complaints of bilateral diffuse breast pain on exam.    Pelvic/Bimanual Patient advised to call [facility] to schedule Pap smear if last Pap smear was greater than 3 years ago if no HPV typing was completed or greater than 5 years if HPV typing was completed.  Smoking History: Patient is a current smoker. Discussed smoking cessation with patient. Referred to the [facility] Quitline and gave resources to the free smoking cessation classes at [facility].   Patient Navigation: Patient education provided. Access to services provided for patient through [facility] program.    Breast and Cervical Cancer Risk Assessment: Patient has family history of her maternal great grandmother and a maternal aunt having breast cancer. Patient has no known genetic mutations or history of radiation treatment to the chest before age 65. Patient does not have history of cervical dysplasia, immunocompromised, or DES exposure in-utero. Breast cancer risk assessment completed. No breast cancer risk calculated due to patient is less than 67 years old.  Risk Assessment    Risk Scores      03/03/2020   Last edited by: Christy Rutherford, LPN   5-year risk:    Lifetime risk:            A: [facility] exam without pap smear Complaint of right breast lump and pain.  P: Referred patient to the [facility] for a bilateral breast ultrasound. Appointment scheduled Wednesday, March 10, 2020 at 1340.  Christy Heidelberg, RN 03/03/2020 4:31 PM

## 2020-03-03 NOTE — Patient Instructions (Signed)
Explained breast self awareness with Harden Mo. Patient stated last Pap smear was around 2018 or 2019. Patient stated she will check on when last Pap smear was completed and get back with [place]. Let her know [facility] will cover Pap smears every 3 years unless has a history of abnormal Pap smears. Patient advised to call [facility] to schedule Pap smear if last Pap smear was greater than 3 years ago if no HPV typing was completed or greater than 5 years if HPV typing was completed. Referred patient to the [facility] for a bilateral breast ultrasound. Appointment scheduled Wednesday, March 10, 2020 at 1340. Patient aware of appointment and will be there. Discussed smoking cessation with patient. Referred to the [facility] Quitline and gave resources to the free smoking cessation classes at [facility].  Harden Mo verbalized understanding.  Nevaya Nagele, Kathaleen Maser, RN 4:31 PM

## 2020-03-10 ENCOUNTER — Other Ambulatory Visit: Payer: Self-pay

## 2020-03-10 ENCOUNTER — Ambulatory Visit
Admission: RE | Admit: 2020-03-10 | Discharge: 2020-03-10 | Disposition: A | Discharge status: Home / Self Care | Payer: No Typology Code available for payment source | Source: Ambulatory Visit | Attending: Obstetrics and Gynecology | Admitting: Obstetrics and Gynecology

## 2020-03-10 DIAGNOSIS — N63 Unspecified lump in unspecified breast: Secondary | ICD-10-CM

## 2020-04-07 ENCOUNTER — Encounter: Payer: Self-pay | Admitting: Emergency Medicine

## 2020-04-07 ENCOUNTER — Ambulatory Visit
Admission: EM | Admit: 2020-04-07 | Discharge: 2020-04-07 | Disposition: A | Discharge status: Home / Self Care | Payer: No Typology Code available for payment source | Attending: Emergency Medicine | Admitting: Emergency Medicine

## 2020-04-07 DIAGNOSIS — J4521 Mild intermittent asthma with (acute) exacerbation: Secondary | ICD-10-CM

## 2020-04-07 MED ORDER — ALBUTEROL SULFATE HFA 108 (90 BASE) MCG/ACT IN AERS: 1.0000 | INHALATION_SPRAY | Freq: Four times a day (QID) | RESPIRATORY_TRACT | 0 refills | Status: DC | PRN

## 2020-04-07 MED ORDER — PREDNISONE 50 MG PO TABS: 50.0000 mg | ORAL_TABLET | Freq: Every day | ORAL | 0 refills | Status: DC

## 2020-04-07 NOTE — ED Triage Notes (Signed)
Pt requesting refill of albuterol inhaler.

## 2020-04-07 NOTE — Discharge Instructions (Signed)
Albuterol refilled Prednisone daily x 5 days Follow up if not improving or worsening

## 2020-04-07 NOTE — ED Provider Notes (Signed)
[facility]    CSN: [id number] Arrival date & time: 04/07/20  7341      History   Chief Complaint Chief Complaint  Patient presents with  . Medication Refill    HPI Christy Little is a 29 y.o. female presenting today for evaluation of medication refill.  Requesting refill of albuterol inhaler.  Reports around this time each year has flaring of asthma symptoms.  Reports increased postnasal drainage and wheezing and shortness of breath.  Denies any fevers.  Denies URI symptoms of cough congestion or sore throat.  Denies any close sick contacts.  HPI  Past Medical History:  Diagnosis Date  . Anxiety   . Asthma   . Constipation   . Gastritis, acute   . History of trichomoniasis   . HSV infection    pt states she started having cold sores in October  . Hypertension   . IBS (irritable bowel syndrome)   . Kidney stones 10/2011  . Sinus infection     Patient Active Problem List   Diagnosis Date Noted  . Hypertension 07/03/2016  . Anxiety and depression 07/03/2016    Past Surgical History:  Procedure Laterality Date  . COLONOSCOPY      OB History    Gravida  0   Para  0   Term  0   Preterm  0   AB  0   Living  0     SAB  0   TAB  0   Ectopic  0   Multiple  0   Live Births  0            Home Medications    Prior to Admission medications   Medication Sig Start Date End Date Taking? Authorizing Provider  albuterol (VENTOLIN HFA) 108 (90 Base) MCG/ACT inhaler Inhale 1-2 puffs into the lungs every 6 (six) hours as needed for wheezing or shortness of breath. 04/07/20   Armaan Pond C, PA-C  naproxen (NAPROSYN) 500 MG tablet Take 1 tablet (500 mg total) by mouth 2 (two) times daily. 12/18/19   Lorelee New, PA-C  predniSONE (DELTASONE) 50 MG tablet Take 1 tablet (50 mg total) by mouth daily. 04/07/20   Shawniece Oyola C, PA-C  cetirizine (ZYRTEC ALLERGY) 10 MG tablet Take 1 tablet (10 mg total) by mouth daily. 03/28/18 04/07/20   Roxy Horseman, PA-C    Family History Family History  Problem Relation Age of Onset  . Uterine cancer Mother   . Asthma Father   . Breast cancer Maternal Aunt   . Breast cancer Maternal Great-grandmother     Social History Social History   Tobacco Use  . Smoking status: Never Smoker  . Smokeless tobacco: Never Used  Vaping Use  . Vaping Use: Never used  Substance Use Topics  . Alcohol use: Yes    Comment: Once every two weeks.   . Drug use: Yes    Frequency: 1.0 times per week    Types: Marijuana    Comment: Everyday.      Allergies   Amoxicillin and Flagyl [metronidazole]   Review of Systems Review of Systems  Constitutional: Negative for activity change, appetite change, chills, fatigue and fever.  HENT: Negative for congestion, ear pain, rhinorrhea, sinus pressure, sore throat and trouble swallowing.   Eyes: Negative for discharge and redness.  Respiratory: Positive for shortness of breath and wheezing. Negative for cough and chest tightness.   Cardiovascular: Negative for chest pain.  Gastrointestinal: Negative for abdominal  pain, diarrhea, nausea and vomiting.  Musculoskeletal: Negative for myalgias.  Skin: Negative for rash.  Neurological: Negative for dizziness, light-headedness and headaches.     Physical Exam Triage Vital Signs ED Triage Vitals  Enc Vitals Group     BP 04/07/20 1114 115/78     Pulse Rate 04/07/20 1114 76     Resp 04/07/20 1114 16     Temp 04/07/20 1114 98.6 F (37 C)     Temp Source 04/07/20 1114 Oral     SpO2 04/07/20 1114 97 %     Weight --      Height --      Head Circumference --      Peak Flow --      Pain Score 04/07/20 1112 0     Pain Loc --      Pain Edu? --      Excl. in GC? --    No data found.  Updated Vital Signs BP 115/78 (BP Location: Left Arm)   Pulse 76   Temp 98.6 F (37 C) (Oral)   Resp 16   LMP 03/22/2020   SpO2 97%   Visual Acuity Right Eye Distance:   Left Eye Distance:   Bilateral  Distance:    Right Eye Near:   Left Eye Near:    Bilateral Near:     Physical Exam Vitals and nursing note reviewed.  Constitutional:      Appearance: She is well-developed.     Comments: No acute distress  HENT:     Head: Normocephalic and atraumatic.     Ears:     Comments: Bilateral ears without tenderness to palpation of external auricle, tragus and mastoid, EAC's without erythema or swelling, TM's with good bony landmarks and cone of light. Non erythematous.     Nose: Nose normal.     Mouth/Throat:     Comments: Oral mucosa pink and moist, no tonsillar enlargement or exudate. Posterior pharynx patent and nonerythematous, no uvula deviation or swelling. Normal phonation. Eyes:     Conjunctiva/sclera: Conjunctivae normal.  Cardiovascular:     Rate and Rhythm: Normal rate.  Pulmonary:     Effort: Pulmonary effort is normal. No respiratory distress.     Comments: Expiratory wheezing throughout bilateral lung fields Abdominal:     General: There is no distension.  Musculoskeletal:        General: Normal range of motion.     Cervical back: Neck supple.  Skin:    General: Skin is warm and dry.  Neurological:     Mental Status: She is alert and oriented to person, place, and time.      UC Treatments / Results  Labs (all labs ordered are listed, but only abnormal results are displayed) Labs Reviewed - No data to display  EKG   Radiology No results found.  Procedures Procedures (including critical care time)  Medications Ordered in UC Medications - No data to display  Initial Impression / Assessment and Plan / UC Course  I have reviewed the triage vital signs and the nursing notes.  Pertinent labs & imaging results that were available during my care of the patient were reviewed by me and considered in my medical decision making (see chart for details).     Asthma exacerbation-albuterol inhaler refilled, provided course of prednisone.  Monitor for resolution  of symptoms,Discussed strict return precautions. Patient verbalized understanding and is agreeable with plan.  Final Clinical Impressions(s) / UC Diagnoses   Final diagnoses:  Mild intermittent asthma with acute exacerbation     Discharge Instructions     Albuterol refilled Prednisone daily x 5 days Follow up if not improving or worsening    ED Prescriptions    Medication Sig Dispense Auth. Provider   albuterol (VENTOLIN HFA) 108 (90 Base) MCG/ACT inhaler Inhale 1-2 puffs into the lungs every 6 (six) hours as needed for wheezing or shortness of breath. 18 g Briseyda Fehr C, PA-C   predniSONE (DELTASONE) 50 MG tablet Take 1 tablet (50 mg total) by mouth daily. 5 tablet Izola Teague, Neola C, PA-C     PDMP not reviewed this encounter.   Lew Dawes, PA-C 04/07/20 1149

## 2020-04-12 ENCOUNTER — Other Ambulatory Visit: Payer: Self-pay | Admitting: *Deleted

## 2020-04-12 ENCOUNTER — Other Ambulatory Visit: Payer: Self-pay

## 2020-04-12 DIAGNOSIS — Z124 Encounter for screening for malignant neoplasm of cervix: Secondary | ICD-10-CM

## 2020-04-12 NOTE — Progress Notes (Signed)
Patient: Christy Little           Date of Birth: 02/04/1991           MRN: [id number] Visit Date: 04/12/2020 PCP: Patient, No Pcp Per  Cervical Cancer Screening Do you smoke?: Yes Have you ever had or been told you have an allergy to latex products?: No Marital status: Single Date of last pap smear: 2-5 yrs ago (09/25/2014-Negative ([facility] @ [place]) ) Date of last menstrual period: 03/22/20 Number of pregnancies: 0 Number of births: 0 Have you ever had any of the following? Hysterectomy: No Tubal ligation (tubes tied): No Abnormal bleeding: No Abnormal pap smear: Yes Venereal warts: No A sex partner with venereal warts: No A high risk* sex partner: No  Cervical Exam  Abnormal Observations: Yellow colored frothy discharge observed in vagina. Patient stated she has a history of chronic BV infections and it usually happens prior to the start of her menstrual period. Patient is allergic to Flagyl and uses Boric Acid to treat her chronic BV.  Recommendations: Pap smear not completed today. Last Pap smear was 09/25/2014 at [facility] and normal. Patient had a wet prep completed 07/03/2016 that patient had thought was her last Pap smear at previous appointment. Per patient has no history of an abnormal Pap smear. Last Pap smear result is available in Epic.      Patient's History Patient Active Problem List   Diagnosis Date Noted  . Hypertension 07/03/2016  . Anxiety and depression 07/03/2016   Past Medical History:  Diagnosis Date  . Anxiety   . Asthma   . Constipation   . Gastritis, acute   . History of trichomoniasis   . HSV infection    pt states she started having cold sores in October  . Hypertension   . IBS (irritable bowel syndrome)   . Kidney stones 10/2011  . Sinus infection     Family History  Problem Relation Age of Onset  . Uterine cancer Mother   . Asthma Father   . Breast cancer Maternal Aunt   . Breast cancer Maternal  Great-grandmother     Social History   Occupational History  . Occupation: Consulting civil engineer  Tobacco Use  . Smoking status: Never Smoker  . Smokeless tobacco: Never Used  Vaping Use  . Vaping Use: Never used  Substance and Sexual Activity  . Alcohol use: Yes    Comment: Once every two weeks.   . Drug use: Yes    Frequency: 1.0 times per week    Types: Marijuana    Comment: Everyday.   Marland Kitchen Sexual activity: Yes    Birth control/protection: Condom

## 2020-04-13 LAB — CYTOLOGY - PAP
Comment: NEGATIVE
Diagnosis: HIGH — AB
High risk HPV: POSITIVE — AB

## 2020-04-14 ENCOUNTER — Telehealth: Payer: Self-pay

## 2020-04-14 NOTE — Telephone Encounter (Signed)
Patient called and requests Pap/HPV results. Patient informed Pap-HSIL, HPV-positive, needs colposcopy per Dr. Jolayne Panther. Information sent to [facility].

## 2020-05-06 ENCOUNTER — Encounter: Payer: Self-pay | Admitting: Family Medicine

## 2020-05-06 ENCOUNTER — Other Ambulatory Visit: Payer: Self-pay

## 2020-05-06 ENCOUNTER — Ambulatory Visit (INDEPENDENT_AMBULATORY_CARE_PROVIDER_SITE_OTHER): Payer: Self-pay | Admitting: Family Medicine

## 2020-05-06 ENCOUNTER — Other Ambulatory Visit (HOSPITAL_COMMUNITY)
Admission: RE | Admit: 2020-05-06 | Discharge: 2020-05-06 | Disposition: A | Discharge status: Home / Self Care | Payer: No Typology Code available for payment source | Source: Ambulatory Visit | Attending: Family Medicine | Admitting: Family Medicine

## 2020-05-06 VITALS — BP 115/79 | HR 76 | Wt 124.2 lb

## 2020-05-06 DIAGNOSIS — R87613 High grade squamous intraepithelial lesion on cytologic smear of cervix (HGSIL): Secondary | ICD-10-CM | POA: Insufficient documentation

## 2020-05-06 DIAGNOSIS — Z3202 Encounter for pregnancy test, result negative: Secondary | ICD-10-CM

## 2020-05-06 LAB — POCT PREGNANCY, URINE: Preg Test, Ur: NEGATIVE

## 2020-05-06 NOTE — Progress Notes (Signed)
Patient Name: Christy Little, female   DOB: 01/20/1991, 29 y.o.  MRN: [id number]  Colposcopy Procedure Note:  [id number] Pregnancy status: Unknown Indications: HSIL Cervical History:  Previous Abnormal Pap: none  Previous Colposcopy: none  Previous LEEP or Cryo: none  Smoking: Never Smoked Hysterectomy: No   Patient given informed consent, signed copy in the chart, time out was performed.    Exam: Vulva and Vagina grossly normal.  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  Will call patient with results.

## 2020-05-10 LAB — SURGICAL PATHOLOGY

## 2020-05-12 ENCOUNTER — Encounter: Payer: Self-pay | Admitting: Family Medicine

## 2020-05-12 ENCOUNTER — Telehealth: Payer: Self-pay

## 2020-05-12 DIAGNOSIS — N870 Mild cervical dysplasia: Secondary | ICD-10-CM | POA: Insufficient documentation

## 2020-05-12 NOTE — Telephone Encounter (Signed)
Patient left message, requesting return call. I returned call, patient stated that she had her colposcopy on 05/06/2020 @ [facility], requests explanation of results. Patient informed that because we are the [facility], a different facility, we are not authorized to give results from [facility], will send message to Charge RN @ [facility]. Patient verbalized understanding, message sent.

## 2020-05-13 ENCOUNTER — Telehealth: Payer: Self-pay | Admitting: General Practice

## 2020-05-13 NOTE — Telephone Encounter (Addendum)
-----   Message from Levie Heritage, DO sent at 05/12/2020  3:45 PM EST ----- Regarding: RE: Colpo results She has mild changes to her cervix on biopsy. She will need a repeat pap and HPV testing in 1 year.  ----- Message ----- From: Kathee Delton, RN Sent: 05/12/2020   1:23 PM EST To: Levie Heritage, DO Subject: FW: Colpo results                              Please advise clinical pool on patient's colpo results/recommended follow up & we'll call her. Thanks! ----- Message ----- From: Narda Rutherford, LPN Sent: 11/10/2261  11:29 AM EST To: Kathee Delton, RN Subject: Colpo results                                  Hi,    Ms. Franze just called and requested explanation of her colpo results (05/06/2020). She has seen them via Mychart.  [phone]  Thank you,   Sherie   ([facility]) [phone] (Tues/Thurs)

## 2020-05-13 NOTE — Telephone Encounter (Signed)
Called patient and explained colpo results to her. Discussed need for follow up pap with HPV testing in 1 year. Patient verbalized understanding and asked if there is anything she needs to take in the meantime or any follow up she needs. Told patient no she just needs to stay healthy and follow up in a year. Patient verbalized understanding.

## 2020-06-15 ENCOUNTER — Other Ambulatory Visit: Payer: Self-pay

## 2020-06-15 ENCOUNTER — Ambulatory Visit
Admission: EM | Admit: 2020-06-15 | Discharge: 2020-06-15 | Disposition: A | Discharge status: Home / Self Care | Payer: No Typology Code available for payment source | Attending: Internal Medicine | Admitting: Internal Medicine

## 2020-06-15 DIAGNOSIS — M436 Torticollis: Secondary | ICD-10-CM

## 2020-06-15 MED ORDER — CYCLOBENZAPRINE HCL 10 MG PO TABS: ORAL_TABLET | ORAL | 0 refills | Status: DC

## 2020-06-15 NOTE — ED Provider Notes (Signed)
[facility]    CSN: [id number] Arrival date & time: 06/15/20  6378      History   Chief Complaint Chief Complaint  Patient presents with  . Torticollis    Painful since Saturday am  . Shoulder Pain    Left side started on sunday    HPI Christy Little is a 30 y.o. female who woke up Sat with L neck pain and radiating to L upper mid arm. Denies injury. Her neck feels stiff and has hard time turing to her L.     Past Medical History:  Diagnosis Date  . Anxiety   . Asthma   . Constipation   . Gastritis, acute   . History of trichomoniasis   . HSV infection    pt states she started having cold sores in October  . Hypertension   . IBS (irritable bowel syndrome)   . Kidney stones 10/2011  . Sinus infection     Patient Active Problem List   Diagnosis Date Noted  . Dysplasia of cervix, low grade (CIN 1) 05/12/2020  . Hypertension 07/03/2016  . Anxiety and depression 07/03/2016    Past Surgical History:  Procedure Laterality Date  . COLONOSCOPY      OB History    Gravida  0   Para  0   Term  0   Preterm  0   AB  0   Living  0     SAB  0   IAB  0   Ectopic  0   Multiple  0   Live Births  0            Home Medications    Prior to Admission medications   Medication Sig Start Date End Date Taking? Authorizing Provider  albuterol (VENTOLIN HFA) 108 (90 Base) MCG/ACT inhaler Inhale 1-2 puffs into the lungs every 6 (six) hours as needed for wheezing or shortness of breath. 04/07/20  Yes Wieters, Hallie C, PA-C  cyclobenzaprine (FLEXERIL) 10 MG tablet 1/2 to 1 tid for 1 week 06/15/20  Yes Rodriguez-Southworth, Nettie Elm, PA-C  naproxen (NAPROSYN) 500 MG tablet Take 1 tablet (500 mg total) by mouth 2 (two) times daily. 12/18/19  Yes Lorelee New, PA-C  cetirizine (ZYRTEC ALLERGY) 10 MG tablet Take 1 tablet (10 mg total) by mouth daily. 03/28/18 04/07/20  Roxy Horseman, PA-C    Family History Family History  Problem Relation Age of  Onset  . Uterine cancer Mother   . Asthma Father   . Breast cancer Maternal Aunt   . Breast cancer Maternal Great-grandmother     Social History Social History   Tobacco Use  . Smoking status: Never Smoker  . Smokeless tobacco: Never Used  Vaping Use  . Vaping Use: Never used  Substance Use Topics  . Alcohol use: Yes    Comment: Once every two weeks.   . Drug use: Yes    Frequency: 1.0 times per week    Types: Marijuana    Comment: Everyday.      Allergies   Amoxicillin and Flagyl [metronidazole]   Review of Systems Review of Systems  Constitutional: Positive for diaphoresis. Negative for chills, fatigue and fever.       Waking up sweating off and on for weeks.   HENT: Negative for congestion, sinus pressure and sneezing.   Respiratory: Negative for cough.   Musculoskeletal: Positive for neck pain and neck stiffness. Negative for arthralgias and gait problem.  Skin: Negative for rash.  Neurological: Negative for numbness and headaches.  Hematological: Negative for adenopathy.      Physical Exam Triage Vital Signs ED Triage Vitals  Enc Vitals Group     BP 06/15/20 0900 114/80     Pulse Rate 06/15/20 0900 80     Resp 06/15/20 0900 20     Temp 06/15/20 0900 98.5 F (36.9 C)     Temp Source 06/15/20 0900 Oral     SpO2 06/15/20 0900 98 %     Weight --      Height --      Head Circumference --      Peak Flow --      Pain Score 06/15/20 0905 6     Pain Loc --      Pain Edu? --      Excl. in GC? --    No data found.  Updated Vital Signs BP 114/80 (BP Location: Left Arm)   Pulse 80   Temp 98.5 F (36.9 C) (Oral)   Resp 20   LMP 06/06/2020 (Exact Date)   SpO2 98%   Visual Acuity Right Eye Distance:   Left Eye Distance:   Bilateral Distance:    Right Eye Near:   Left Eye Near:    Bilateral Near:     Physical Exam Vitals and nursing note reviewed.  Constitutional:      General: She is not in acute distress.    Appearance: She is not  toxic-appearing.  HENT:     Right Ear: External ear normal.     Left Ear: External ear normal.  Eyes:     General: No scleral icterus.    Conjunctiva/sclera: Conjunctivae normal.  Neck:     Comments: L trapezius tender and tense Has decreased rotation to her L and anterior flexion Pulmonary:     Effort: Pulmonary effort is normal.  Musculoskeletal:        General: Normal range of motion.  Lymphadenopathy:     Cervical: No cervical adenopathy.  Skin:    General: Skin is warm and dry.     Findings: No rash.  Neurological:     Mental Status: She is alert and oriented to person, place, and time.     Motor: No weakness.     Gait: Gait normal.     Deep Tendon Reflexes: Reflexes normal.  Psychiatric:        Mood and Affect: Mood normal.        Behavior: Behavior normal.        Thought Content: Thought content normal.        Judgment: Judgment normal.      UC Treatments / Results  Labs (all labs ordered are listed, but only abnormal results are displayed) Labs Reviewed - No data to display  EKG   Radiology No results found.  Procedures Procedures (including critical care time)  Medications Ordered in UC Medications - No data to display  Initial Impression / Assessment and Plan / UC Course  I have reviewed the triage vital signs and the nursing notes. Has torticollis. I placed her on Flexeril as noted and may take Advil up to 600 mg q8h for pain. See instructions.  Final Clinical Impressions(s) / UC Diagnoses   Final diagnoses:  Torticollis, acute     Discharge Instructions     Continue Advil up to 600 mg every 8 hour for pain  Use heat and do stretches after this     ED Prescriptions  Medication Sig Dispense Auth. Provider   cyclobenzaprine (FLEXERIL) 10 MG tablet 1/2 to 1 tid for 1 week 21 tablet Rodriguez-Southworth, Nettie Elm, PA-C     PDMP not reviewed this encounter.   Garey Ham, [place] 06/18/20 3559

## 2020-06-15 NOTE — ED Triage Notes (Signed)
Patient states she woke Saturday morning and had right  neck pain that worsened and spread to the left side as well. Pt states the pain is now radiating into her left shoulder as well. Pt is aox4 and ambulatory.

## 2020-06-15 NOTE — Discharge Instructions (Signed)
Continue Advil up to 600 mg every 8 hour for pain  Use heat and do stretches after this

## 2020-09-10 ENCOUNTER — Ambulatory Visit: Payer: Self-pay

## 2020-09-15 ENCOUNTER — Other Ambulatory Visit: Payer: Self-pay

## 2020-09-15 ENCOUNTER — Encounter (HOSPITAL_COMMUNITY): Payer: Self-pay | Admitting: Emergency Medicine

## 2020-09-15 ENCOUNTER — Emergency Department (HOSPITAL_COMMUNITY)
Admission: EM | Admit: 2020-09-15 | Discharge: 2020-09-15 | Disposition: A | Discharge status: Home / Self Care | Payer: No Typology Code available for payment source | Attending: Emergency Medicine | Admitting: Emergency Medicine

## 2020-09-15 DIAGNOSIS — I10 Essential (primary) hypertension: Secondary | ICD-10-CM | POA: Insufficient documentation

## 2020-09-15 DIAGNOSIS — U071 COVID-19: Secondary | ICD-10-CM | POA: Insufficient documentation

## 2020-09-15 DIAGNOSIS — J45909 Unspecified asthma, uncomplicated: Secondary | ICD-10-CM | POA: Insufficient documentation

## 2020-09-15 DIAGNOSIS — Z28310 Unvaccinated for covid-19: Secondary | ICD-10-CM | POA: Insufficient documentation

## 2020-09-15 LAB — RESP PANEL BY RT-PCR (FLU A&B, COVID) ARPGX2
Influenza A by PCR: NEGATIVE
Influenza B by PCR: NEGATIVE
SARS Coronavirus 2 by RT PCR: POSITIVE — AB

## 2020-09-15 MED ORDER — ALBUTEROL SULFATE HFA 108 (90 BASE) MCG/ACT IN AERS: 1.0000 | INHALATION_SPRAY | Freq: Four times a day (QID) | RESPIRATORY_TRACT | 0 refills | Status: AC | PRN

## 2020-09-15 MED ORDER — BENZONATATE 100 MG PO CAPS: 100.0000 mg | ORAL_CAPSULE | Freq: Three times a day (TID) | ORAL | 0 refills | Status: DC

## 2020-09-15 NOTE — ED Provider Notes (Signed)
[facility] EMERGENCY DEPARTMENT Provider Note   CSN: [id number] Arrival date & time: 09/15/20  1703     History Chief Complaint  Patient presents with  . Flu-like symptoms    Christy Little is a 30 y.o. female.  HPI   This patient is a 30 year old female, she presents to the hospital today with a complaint of fever, chills, headache, body aches, coughing and the decreased appetite.  She reports that she started feeling this way about 48 hours ago.  She denies having any recent urinary symptoms or gastrointestinal symptoms however she has been exposed to both her boyfriend and both of her parents all of whom have had different symptoms over the last couple of weeks including respiratory and gastrointestinal illnesses.  Nobody has been tested for COVID, she was tested today.  She has been taking some over-the-counter medications with some transient relief but still feeling poorly and wanted to be evaluated primarily because she had had a tick bite on her leg several weeks ago, inside of the left thigh, there was no significant rash associated with this.  The patient has not vaccinated for COVID  Past Medical History:  Diagnosis Date  . Anxiety   . Asthma   . Constipation   . Gastritis, acute   . History of trichomoniasis   . HSV infection    pt states she started having cold sores in October  . Hypertension   . IBS (irritable bowel syndrome)   . Kidney stones 10/2011  . Sinus infection     Patient Active Problem List   Diagnosis Date Noted  . Dysplasia of cervix, low grade (CIN 1) 05/12/2020  . Hypertension 07/03/2016  . Anxiety and depression 07/03/2016    Past Surgical History:  Procedure Laterality Date  . COLONOSCOPY       OB History    Gravida  0   Para  0   Term  0   Preterm  0   AB  0   Living  0     SAB  0   IAB  0   Ectopic  0   Multiple  0   Live Births  0           Family History  Problem Relation Age of Onset  .  Uterine cancer Mother   . Asthma Father   . Breast cancer Maternal Aunt   . Breast cancer Maternal Great-grandmother     Social History   Tobacco Use  . Smoking status: Never Smoker  . Smokeless tobacco: Never Used  Vaping Use  . Vaping Use: Never used  Substance Use Topics  . Alcohol use: Yes    Comment: Once every two weeks.   . Drug use: Yes    Frequency: 1.0 times per week    Types: Marijuana    Comment: Everyday.     Home Medications Prior to Admission medications   Medication Sig Start Date End Date Taking? Authorizing Provider  benzonatate (TESSALON) 100 MG capsule Take 1 capsule (100 mg total) by mouth every 8 (eight) hours. 09/15/20  Yes Eber Hong, MD  albuterol (VENTOLIN HFA) 108 (90 Base) MCG/ACT inhaler Inhale 1-2 puffs into the lungs every 6 (six) hours as needed for wheezing or shortness of breath. 09/15/20   Eber Hong, MD  cyclobenzaprine (FLEXERIL) 10 MG tablet 1/2 to 1 tid for 1 week 06/15/20   Rodriguez-Southworth, Nettie Elm, PA-C  naproxen (NAPROSYN) 500 MG tablet Take 1 tablet (500 mg total)  by mouth 2 (two) times daily. 12/18/19   Lorelee New, PA-C  cetirizine (ZYRTEC ALLERGY) 10 MG tablet Take 1 tablet (10 mg total) by mouth daily. 03/28/18 04/07/20  Roxy Horseman, PA-C    Allergies    Amoxicillin and Flagyl [metronidazole]  Review of Systems   Review of Systems  All other systems reviewed and are negative.   Physical Exam Updated Vital Signs BP 109/66 (BP Location: Right Arm)   Pulse 79   Temp 99 F (37.2 C) (Oral)   Resp 14   SpO2 100%   Physical Exam Vitals and nursing note reviewed.  Constitutional:      General: She is not in acute distress.    Appearance: She is well-developed.  HENT:     Head: Normocephalic and atraumatic.     Mouth/Throat:     Mouth: Mucous membranes are moist.     Pharynx: Posterior oropharyngeal erythema present. No oropharyngeal exudate.  Eyes:     General: No scleral icterus.       Right eye: No  discharge.        Left eye: No discharge.     Conjunctiva/sclera: Conjunctivae normal.     Pupils: Pupils are equal, round, and reactive to light.  Neck:     Thyroid: No thyromegaly.     Vascular: No JVD.  Cardiovascular:     Rate and Rhythm: Normal rate and regular rhythm.     Heart sounds: Normal heart sounds. No murmur heard. No friction rub. No gallop.   Pulmonary:     Effort: Pulmonary effort is normal. No respiratory distress.     Breath sounds: Normal breath sounds. No wheezing or rales.  Abdominal:     General: Bowel sounds are normal. There is no distension.     Palpations: Abdomen is soft. There is no mass.     Tenderness: There is no abdominal tenderness.  Musculoskeletal:        General: No tenderness. Normal range of motion.     Cervical back: Normal range of motion and neck supple.  Lymphadenopathy:     Cervical: No cervical adenopathy.  Skin:    General: Skin is warm and dry.     Findings: No erythema or rash.     Comments: No rash at the site of the tick bite on the left leg  Neurological:     Mental Status: She is alert.     Coordination: Coordination normal.  Psychiatric:        Behavior: Behavior normal.     ED Results / Procedures / Treatments   Labs (all labs ordered are listed, but only abnormal results are displayed) Labs Reviewed  RESP PANEL BY RT-PCR (FLU A&B, COVID) ARPGX2 - Abnormal; Notable for the following components:      Result Value   SARS Coronavirus 2 by RT PCR POSITIVE (*)    All other components within normal limits  ROCKY MTN SPOTTED FVR ABS PNL(IGG+IGM)    EKG None  Radiology No results found.  Procedures Procedures   Medications Ordered in ED Medications - No data to display  ED Course  I have reviewed the triage vital signs and the nursing notes.  Pertinent labs & imaging results that were available during my care of the patient were reviewed by me and considered in my medical decision making (see chart for  details).    MDM Rules/Calculators/A&P                         \  The patient is positive for COVID, I suspect this is the cause of her symptoms which were again headache, nasal congestion, body aches and now fever with coughing.  She is not short of breath, she has normal vital signs including an oxygen of 100% with clear lungs, temperature of 99 degrees and a pulse of 79 all suggestive that she has a mild viral illness.  She has been informed of the restrictions as far as quarantine and will be given a prescription for albuterol, Tessalon and asked to use DayQuil and NyQuil to help with her other symptoms.  She is agreeable  Santos Germany was evaluated in Emergency Department on 09/15/2020 for the symptoms described in the history of present illness. She was evaluated in the context of the global COVID-19 pandemic, which necessitated consideration that the patient might be at risk for infection with the SARS-CoV-2 virus that causes COVID-19. Institutional protocols and algorithms that pertain to the evaluation of patients at risk for COVID-19 are in a state of rapid change based on information released by regulatory bodies including the CDC and federal and state organizations. These policies and algorithms were followed during the patient's care in the ED.   Final Clinical Impression(s) / ED Diagnoses Final diagnoses:  COVID-19    Rx / DC Orders ED Discharge Orders         Ordered    albuterol (VENTOLIN HFA) 108 (90 Base) MCG/ACT inhaler  Every 6 hours PRN        09/15/20 2100    benzonatate (TESSALON) 100 MG capsule  Every 8 hours        09/15/20 2100           Eber Hong, MD 09/15/20 2101

## 2020-09-15 NOTE — ED Notes (Signed)
All appropriate discharge materials reviewed at length with patient. Time for questions provided. Pt has no other questions at this time and verbalizes understanding of all provided materials.  

## 2020-09-15 NOTE — ED Provider Notes (Signed)
Emergency Medicine Provider Triage Evaluation Note  Christy Little 30 y.o. female was evaluated in triage.  Pt complains of 3 days of body aches, fevers, muscle soreness, cough, decreased appetite.  He she has had a low-grade fever at home.  She states she has felt tired, achy.  She states that she has had a slight cough.  No chest pain, difficulty breathing, abdominal pain.  She states that family members have been sick with similar symptoms.  She has not been COVID vaccinated.  She also reports that last week, prior to onset of symptoms, she found a tick on her left leg.  She had some redness and swelling to the area.  She removed the tick resolved.   Review of Systems  Positive: Fever, body aches, decreased appetite, cough Negative: Chest pain, difficulty breathing, abdominal pain  Physical Exam  BP 134/82   Pulse 70   Temp 98.2 F (36.8 C) (Oral)   Resp 18   Ht 5\' 4"  (1.626 m)   Wt 65.8 kg   SpO2 100%   BMI 24.89 kg/m  Gen:   Awake, no distress  = HEENT:  Atraumatic.  Neck is supple and without rigidity. Resp:  Normal effort  Cardiac:  Normal rate  Abd:   Nondistended, nontender  MSK:   Moves extremities without difficulty  Neuro:  Speech clear   Other:   Small wound noted to the medial aspect of her left lower extremity.  No surrounding warmth, erythema.  Medical Decision Making  Medically screening exam initiated at 5:23 PM  Appropriate orders placed.  Christy Little was informed that the remainder of the evaluation will be completed by another provider, this initial triage assessment does not replace that evaluation, and the importance of remaining in the ED until their evaluation is complete.   Clinical Impression  Fever, cough, body aches   Portions of this note were generated with Dragon dictation software. Dictation errors may occur despite best attempts at proofreading.     Harden Mo, PA-C 09/15/20 1724    11/15/20, MD 09/15/20 2052

## 2020-09-15 NOTE — ED Notes (Signed)
Patient moved to appropriate seating area 

## 2020-09-15 NOTE — ED Notes (Signed)
Pt also reports finding a tick on her left leg with swelling and redness to the area.

## 2020-09-15 NOTE — ED Triage Notes (Signed)
Pt c/o neck pain, cough, fever and fatigue. Unvaccinated for covid, +sick contacts.

## 2020-09-15 NOTE — Discharge Instructions (Signed)
Please quarantine away from people for 5 days from today.  You may go back to work on Monday with a mask on for another 5 days if you are fever free and improving You will need to strictly quarantine for a total of 10 days if you are not fever free or not improving  DayQuil or NyQuil, Tessalon every 8 hours as needed for coughing, albuterol 2 puffs every 4 hours as needed for difficulty breathing  Please return to the emergency department for severe chest pain fevers or worsening breathing

## 2020-09-17 LAB — ROCKY MTN SPOTTED FVR ABS PNL(IGG+IGM)
RMSF IgG: NEGATIVE
RMSF IgM: 0.24 index (ref 0.00–0.89)

## 2020-12-31 ENCOUNTER — Other Ambulatory Visit: Payer: Self-pay

## 2020-12-31 ENCOUNTER — Ambulatory Visit
Admission: RE | Admit: 2020-12-31 | Discharge: 2020-12-31 | Disposition: A | Discharge status: Home / Self Care | Payer: No Typology Code available for payment source | Source: Ambulatory Visit | Attending: Internal Medicine | Admitting: Internal Medicine

## 2020-12-31 VITALS — BP 126/92 | HR 70 | Temp 98.6°F | Resp 18

## 2020-12-31 DIAGNOSIS — N939 Abnormal uterine and vaginal bleeding, unspecified: Secondary | ICD-10-CM | POA: Insufficient documentation

## 2020-12-31 DIAGNOSIS — N898 Other specified noninflammatory disorders of vagina: Secondary | ICD-10-CM | POA: Insufficient documentation

## 2020-12-31 LAB — POCT URINALYSIS DIP (MANUAL ENTRY)
Bilirubin, UA: NEGATIVE
Glucose, UA: NEGATIVE mg/dL
Ketones, POC UA: NEGATIVE mg/dL
Leukocytes, UA: NEGATIVE
Nitrite, UA: NEGATIVE
Protein Ur, POC: NEGATIVE mg/dL
Spec Grav, UA: 1.015 (ref 1.010–1.025)
Urobilinogen, UA: 0.2 E.U./dL
pH, UA: 6 (ref 5.0–8.0)

## 2020-12-31 LAB — POCT URINE PREGNANCY: Preg Test, Ur: NEGATIVE

## 2020-12-31 NOTE — ED Provider Notes (Signed)
[facility]    CSN: [id number] Arrival date & time: 12/31/20  1502      History   Chief Complaint Chief Complaint  Patient presents with   Vaginal Bleeding    HPI Christy Little is a 30 y.o. female.   Patient presents with 3-day history of vaginal bleeding and vaginal discharge.  Vaginal discharge is described as white to clear.  States that her last normal menstrual cycle was on 8/11 that lasted for 7 days.  Bleeding restarted approximately 3 days ago and is described as spotting.  Patient has also been having some mild intermittent abdominal cramping.  Denies any back pain, fever, pelvic pain, known exposure to STD, urinary frequency, urinary burning, vaginal irritation.  Patient states that she has had intermittent spotting between periods previously.  Denies any nausea, vomiting, dizziness, headache, blurred vision, chest pain, shortness of breath.   Vaginal Bleeding  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   Hypertension 07/03/2016   Anxiety and depression 07/03/2016    Past Surgical History:  Procedure Laterality Date   COLONOSCOPY      OB History     Gravida  0   Para  0   Term  0   Preterm  0   AB  0   Living  0      SAB  0   IAB  0   Ectopic  0   Multiple  0   Live Births  0            Home Medications    Prior to Admission medications   Medication Sig Start Date End Date Taking? Authorizing Provider  albuterol (VENTOLIN HFA) 108 (90 Base) MCG/ACT inhaler Inhale 1-2 puffs into the lungs every 6 (six) hours as needed for wheezing or shortness of breath. 09/15/20    Eber Hong, MD  benzonatate (TESSALON) 100 MG capsule Take 1 capsule (100 mg total) by mouth every 8 (eight) hours. 09/15/20   Eber Hong, MD  cyclobenzaprine (FLEXERIL) 10 MG tablet 1/2 to 1 tid for 1 week 06/15/20   Rodriguez-Southworth, Nettie Elm, PA-C  naproxen (NAPROSYN) 500 MG tablet Take 1 tablet (500 mg total) by mouth 2 (two) times daily. 12/18/19   Lorelee New, PA-C  cetirizine (ZYRTEC ALLERGY) 10 MG tablet Take 1 tablet (10 mg total) by mouth daily. 03/28/18 04/07/20  Roxy Horseman, PA-C    Family History Family History  Problem Relation Age of Onset   Uterine cancer Mother    Asthma Father    Breast cancer Maternal Aunt    Breast cancer Maternal Great-grandmother     Social History Social History   Tobacco Use   Smoking status: Never   Smokeless tobacco: Never  Vaping Use   Vaping Use: Never used  Substance Use Topics   Alcohol use: Yes    Comment: Once every two weeks.    Drug use: Yes    Frequency: 1.0 times per week    Types: Marijuana    Comment: Everyday.  Allergies   Amoxicillin and Flagyl [metronidazole]   Review of Systems Review of Systems Per HPI  Physical Exam Triage Vital Signs ED Triage Vitals [12/31/20 1514]  Enc Vitals Group     BP (!) 126/92     Pulse Rate 70     Resp 18     Temp 98.6 F (37 C)     Temp Source Oral     SpO2 98 %     Weight      Height      Head Circumference      Peak Flow      Pain Score 0     Pain Loc      Pain Edu?      Excl. in GC?    No data found.  Updated Vital Signs BP (!) 126/92 (BP Location: Left Arm)   Pulse 70   Temp 98.6 F (37 C) (Oral)   Resp 18   LMP 12/06/2020 (Approximate)   SpO2 98%   Visual Acuity Right Eye Distance:   Left Eye Distance:   Bilateral Distance:    Right Eye Near:   Left Eye Near:    Bilateral Near:     Physical Exam Constitutional:      Appearance: Normal appearance.  HENT:     Head: Normocephalic and atraumatic.  Eyes:     Extraocular  Movements: Extraocular movements intact.     Conjunctiva/sclera: Conjunctivae normal.  Cardiovascular:     Rate and Rhythm: Normal rate and regular rhythm.     Pulses: Normal pulses.     Heart sounds: Normal heart sounds.  Pulmonary:     Effort: Pulmonary effort is normal.     Breath sounds: Normal breath sounds.  Abdominal:     General: Abdomen is flat. Bowel sounds are normal. There is no distension.     Palpations: Abdomen is soft.     Tenderness: There is no abdominal tenderness.  Genitourinary:    Comments: Deferred with shared decision making. Self swab performed.  Skin:    General: Skin is warm and dry.  Neurological:     General: No focal deficit present.     Mental Status: She is alert and oriented to person, place, and time. Mental status is at baseline.  Psychiatric:        Mood and Affect: Mood normal.        Behavior: Behavior normal.        Thought Content: Thought content normal.        Judgment: Judgment normal.     UC Treatments / Results  Labs (all labs ordered are listed, but only abnormal results are displayed) Labs Reviewed  POCT URINALYSIS DIP (MANUAL ENTRY) - Abnormal; Notable for the following components:      Result Value   Clarity, UA cloudy (*)    Blood, UA moderate (*)    All other components within normal limits  POCT URINE PREGNANCY  CERVICOVAGINAL ANCILLARY ONLY    EKG   Radiology No results found.  Procedures Procedures (including critical care time)  Medications Ordered in UC Medications - No data to display  Initial Impression / Assessment and Plan / UC Course  I have reviewed the triage vital signs and the nursing notes.  Pertinent labs & imaging results that were available during my care of the patient were reviewed by me and considered in my medical decision making (see chart for details).     Urinalysis did not show signs of urinary  tract infection.  Do not think urine culture is necessary at this time.  Vaginal swab Is  pending.  will await results and treat appropriately.  Urine pregnancy was also negative. Advised patient to go to the hospital if symptoms significantly worsen and to follow-up with gynecology if symptoms persist.  Vitals are stable. Do not think that patient is in need of immediate medical attention at emergency department at this time. discussed strict return precautions. Patient verbalized understanding and is agreeable with plan.  Final Clinical Impressions(s) / UC Diagnoses   Final diagnoses:  Vaginal discharge  Vaginal bleeding, abnormal     Discharge Instructions      Your urine did not show any signs of urinary tract infection.  vaginal swab is pending.  We will call if these are positive.  Refrain from any sexual activity until test results are complete.     ED Prescriptions   None    PDMP not reviewed this encounter.   Lance Muss, FNP 12/31/20 [phone]

## 2020-12-31 NOTE — ED Triage Notes (Signed)
Pt states she completed her cycle earlier this month. C/o spotting that started several days ago as well as abdominal pain. Pt also c/o discharge described as "how a yeast infection is but with blood." Denies polyuria, burning or itching, or odor.

## 2020-12-31 NOTE — Discharge Instructions (Addendum)
Your urine did not show any signs of urinary tract infection.  vaginal swab is pending.  We will call if these are positive.  Refrain from any sexual activity until test results are complete.

## 2021-01-03 LAB — CERVICOVAGINAL ANCILLARY ONLY
Bacterial Vaginitis (gardnerella): POSITIVE — AB
Candida Glabrata: NEGATIVE
Candida Vaginitis: NEGATIVE
Chlamydia: NEGATIVE
Comment: NEGATIVE
Comment: NEGATIVE
Comment: NEGATIVE
Comment: NEGATIVE
Comment: NEGATIVE
Comment: NORMAL
Neisseria Gonorrhea: NEGATIVE
Trichomonas: NEGATIVE

## 2021-01-05 ENCOUNTER — Telehealth (HOSPITAL_COMMUNITY): Payer: Self-pay | Admitting: Emergency Medicine

## 2021-01-05 MED ORDER — CLINDAMYCIN PHOSPHATE 2 % VA CREA: 1.0000 | TOPICAL_CREAM | Freq: Every day | VAGINAL | 0 refills | Status: AC

## 2021-03-19 ENCOUNTER — Other Ambulatory Visit: Payer: Self-pay

## 2021-03-19 ENCOUNTER — Encounter (HOSPITAL_COMMUNITY): Payer: Self-pay | Admitting: Emergency Medicine

## 2021-03-19 ENCOUNTER — Emergency Department (HOSPITAL_COMMUNITY)
Admission: EM | Admit: 2021-03-19 | Discharge: 2021-03-20 | Disposition: A | Discharge status: Home / Self Care | Payer: Self-pay | Attending: Emergency Medicine | Admitting: Emergency Medicine

## 2021-03-19 ENCOUNTER — Emergency Department (HOSPITAL_COMMUNITY): Payer: Self-pay

## 2021-03-19 DIAGNOSIS — I10 Essential (primary) hypertension: Secondary | ICD-10-CM | POA: Insufficient documentation

## 2021-03-19 DIAGNOSIS — J101 Influenza due to other identified influenza virus with other respiratory manifestations: Secondary | ICD-10-CM | POA: Insufficient documentation

## 2021-03-19 DIAGNOSIS — B349 Viral infection, unspecified: Secondary | ICD-10-CM

## 2021-03-19 DIAGNOSIS — R000 Tachycardia, unspecified: Secondary | ICD-10-CM | POA: Insufficient documentation

## 2021-03-19 DIAGNOSIS — N9489 Other specified conditions associated with female genital organs and menstrual cycle: Secondary | ICD-10-CM | POA: Insufficient documentation

## 2021-03-19 DIAGNOSIS — J9801 Acute bronchospasm: Secondary | ICD-10-CM | POA: Insufficient documentation

## 2021-03-19 DIAGNOSIS — Z20822 Contact with and (suspected) exposure to covid-19: Secondary | ICD-10-CM | POA: Insufficient documentation

## 2021-03-19 LAB — CBC WITH DIFFERENTIAL/PLATELET
Abs Immature Granulocytes: 0.02 10*3/uL (ref 0.00–0.07)
Basophils Absolute: 0 10*3/uL (ref 0.0–0.1)
Basophils Relative: 0 %
Eosinophils Absolute: 0 10*3/uL (ref 0.0–0.5)
Eosinophils Relative: 0 %
HCT: 46.4 % — ABNORMAL HIGH (ref 36.0–46.0)
Hemoglobin: 15.5 g/dL — ABNORMAL HIGH (ref 12.0–15.0)
Immature Granulocytes: 0 %
Lymphocytes Relative: 6 %
Lymphs Abs: 0.4 10*3/uL — ABNORMAL LOW (ref 0.7–4.0)
MCH: 27.2 pg (ref 26.0–34.0)
MCHC: 33.4 g/dL (ref 30.0–36.0)
MCV: 81.5 fL (ref 80.0–100.0)
Monocytes Absolute: 0.7 10*3/uL (ref 0.1–1.0)
Monocytes Relative: 10 %
Neutro Abs: 5.6 10*3/uL (ref 1.7–7.7)
Neutrophils Relative %: 84 %
Platelets: 231 10*3/uL (ref 150–400)
RBC: 5.69 MIL/uL — ABNORMAL HIGH (ref 3.87–5.11)
RDW: 14.6 % (ref 11.5–15.5)
WBC: 6.7 10*3/uL (ref 4.0–10.5)
nRBC: 0 % (ref 0.0–0.2)

## 2021-03-19 LAB — BASIC METABOLIC PANEL
Anion gap: 15 (ref 5–15)
BUN: 6 mg/dL (ref 6–20)
CO2: 16 mmol/L — ABNORMAL LOW (ref 22–32)
Calcium: 9.4 mg/dL (ref 8.9–10.3)
Chloride: 105 mmol/L (ref 98–111)
Creatinine, Ser: 0.81 mg/dL (ref 0.44–1.00)
GFR, Estimated: >60 mL/min (ref >60)
Glucose, Bld: 100 mg/dL — ABNORMAL HIGH (ref 70–99)
Potassium: 4.1 mmol/L (ref 3.5–5.1)
Sodium: 136 mmol/L (ref 135–145)

## 2021-03-19 LAB — I-STAT BETA HCG BLOOD, ED (MC, WL, AP ONLY): I-stat hCG, quantitative: <5 m[IU]/mL (ref <5)

## 2021-03-19 LAB — RESP PANEL BY RT-PCR (FLU A&B, COVID) ARPGX2
Influenza A by PCR: POSITIVE — AB
Influenza B by PCR: NEGATIVE
SARS Coronavirus 2 by RT PCR: NEGATIVE

## 2021-03-19 MED ORDER — LACTATED RINGERS IV BOLUS
2000.0000 mL | Freq: Once | INTRAVENOUS | Status: AC
  Administered 2021-03-19: 2000 mL via INTRAVENOUS

## 2021-03-19 MED ORDER — METHYLPREDNISOLONE SODIUM SUCC 125 MG IJ SOLR
125.0000 mg | Freq: Once | INTRAMUSCULAR | Status: AC
  Administered 2021-03-19: 125 mg via INTRAVENOUS
  Filled 2021-03-19: qty 2

## 2021-03-19 MED ORDER — ALBUTEROL SULFATE (2.5 MG/3ML) 0.083% IN NEBU
5.0000 mg | INHALATION_SOLUTION | Freq: Once | RESPIRATORY_TRACT | Status: AC
  Administered 2021-03-19: 5 mg via RESPIRATORY_TRACT
  Filled 2021-03-19: qty 6

## 2021-03-19 MED ORDER — DEXAMETHASONE 4 MG PO TABS
10.0000 mg | ORAL_TABLET | Freq: Once | ORAL | Status: AC
  Administered 2021-03-19: 10 mg via ORAL
  Filled 2021-03-19: qty 3

## 2021-03-19 MED ORDER — ACETAMINOPHEN 325 MG PO TABS
650.0000 mg | ORAL_TABLET | Freq: Once | ORAL | Status: AC
  Administered 2021-03-19: 650 mg via ORAL
  Filled 2021-03-19: qty 2

## 2021-03-19 MED ORDER — ONDANSETRON HCL 4 MG/2ML IJ SOLN
4.0000 mg | Freq: Once | INTRAMUSCULAR | Status: DC
  Filled 2021-03-19: qty 2

## 2021-03-19 MED ORDER — ACETAMINOPHEN 325 MG PO TABS
650.0000 mg | ORAL_TABLET | Freq: Once | ORAL | Status: AC | PRN
  Administered 2021-03-19: 650 mg via ORAL

## 2021-03-19 MED ORDER — KETOROLAC TROMETHAMINE 15 MG/ML IJ SOLN
15.0000 mg | Freq: Once | INTRAMUSCULAR | Status: AC
  Administered 2021-03-19: 15 mg via INTRAVENOUS
  Filled 2021-03-19: qty 1

## 2021-03-19 MED ORDER — IPRATROPIUM BROMIDE 0.02 % IN SOLN
0.5000 mg | Freq: Once | RESPIRATORY_TRACT | Status: AC
  Administered 2021-03-19: 0.5 mg via RESPIRATORY_TRACT
  Filled 2021-03-19: qty 2.5

## 2021-03-19 MED ORDER — SODIUM CHLORIDE 0.9 % IV BOLUS: 1000.0000 mL | Freq: Once | INTRAVENOUS | Status: DC

## 2021-03-19 NOTE — ED Provider Notes (Signed)
Emergency Medicine Provider Triage Evaluation Note  Evon Dejarnett , a 30 y.o. female  was evaluated in triage.  Pt complains of cough, chills, bodyaches, headache, congestion, nausea, vomiting since Thursday. Tylenol with some improvement. No chest pain, shortness of breath. No flu vaccine this year.  Review of Systems  Positive: As above Negative: As above  Physical Exam  BP (!) 135/91 (BP Location: Right Arm)   Pulse (!) 116   Temp (!) 100.9 F (38.3 C) (Oral)   Resp 18   LMP 02/24/2021   SpO2 100%  Gen:   Awake, somewhat ill appearing Resp:  Normal effort  MSK:   Moves extremities without difficulty  Other:  Shaking, chills, tachy with normal rhythm  Medical Decision Making  Medically screening exam initiated at 3:12 PM.  Appropriate orders placed.  Carling Liberman was informed that the remainder of the evaluation will be completed by another provider, this initial triage assessment does not replace that evaluation, and the importance of remaining in the ED until their evaluation is complete.  Flu symptoms   [place] 03/19/21 1513    Gloris Manchester, MD 03/20/21 [phone]

## 2021-03-19 NOTE — ED Notes (Signed)
Called pt x3 for vitals, no response. 

## 2021-03-19 NOTE — ED Triage Notes (Signed)
C/o fever, chills, body aches, headache, cough, and congestion since Thursday.

## 2021-03-19 NOTE — ED Provider Notes (Signed)
[facility] EMERGENCY DEPARTMENT Provider Note   CSN: [id number] Arrival date & time: 03/19/21  1343     History Chief Complaint  Patient presents with   Generalized Body Aches   Chills    Wednesday Christy Little is a 29 y.o. female.  30 year old female presents to the emergency department for evaluation of cough, congestion, chills and myalgias.  Her symptoms began on Thursday and have been persistent, waxing and waning in severity.  She has started to feel a diffuse sense of chest tightness with some discomfort across her back today.  This is only temporarily improved with her home inhaler.  She does have a history of asthma and reports chest tightness feels similar to prior asthma exacerbations.  She has had an associated fever which has been waxing and waning in severity, persistent.  Has been using Tylenol at home for this with some improvement.  Has not been vaccinated for influenza.  Had COVID this past May.  The history is provided by the patient. No language interpreter was used.      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   Hypertension 07/03/2016   Anxiety and depression 07/03/2016    Past Surgical History:  Procedure Laterality Date   COLONOSCOPY       OB History     Gravida  0   Para  0   Term  0   Preterm  0   AB  0   Living  0      SAB  0   IAB  0   Ectopic  0   Multiple  0   Live Births  0           Family History  Problem Relation Age of Onset   Uterine cancer Mother    Asthma Father    Breast cancer Maternal Aunt    Breast cancer Maternal Great-grandmother      Social History   Tobacco Use   Smoking status: Never   Smokeless tobacco: Never  Vaping Use   Vaping Use: Never used  Substance Use Topics   Alcohol use: Yes    Comment: Once every two weeks.    Drug use: Yes    Frequency: 1.0 times per week    Types: Marijuana    Comment: Everyday.     Home Medications Prior to Admission medications   Medication Sig Start Date End Date Taking? Authorizing Provider  albuterol (VENTOLIN HFA) 108 (90 Base) MCG/ACT inhaler Inhale 1-2 puffs into the lungs every 6 (six) hours as needed for wheezing or shortness of breath. 09/15/20   Eber Hong, MD  benzonatate (TESSALON) 100 MG capsule Take 1 capsule (100 mg total) by mouth every 8 (eight) hours. 09/15/20   Eber Hong, MD  cyclobenzaprine (FLEXERIL) 10 MG tablet 1/2 to 1 tid for 1 week 06/15/20   Rodriguez-Southworth, Nettie Elm, PA-C  naproxen (NAPROSYN) 500 MG tablet Take 1 tablet (500 mg total) by mouth 2 (two) times daily. 12/18/19   Lorelee New, PA-C  cetirizine (ZYRTEC ALLERGY) 10 MG tablet Take 1 tablet (10 mg total) by mouth daily. 03/28/18 04/07/20  Roxy Horseman, PA-C    Allergies    Amoxicillin and Flagyl [metronidazole]  Review of Systems   Review of Systems Ten systems reviewed and are negative for acute change, except as noted in the HPI.    Physical Exam Updated Vital Signs BP (!) 134/102   Pulse (!) 112   Temp 98.8 F (37.1 C) (Oral)   Resp (!) 23   LMP 02/24/2021 Comment: pt shielded  SpO2 98%   Physical Exam Vitals and nursing note reviewed.  Constitutional:      General: She is not in acute distress.    Appearance: She is well-developed. She is not diaphoretic.     Comments: Mildly anxious appearing, but in NAD  HENT:     Head: Normocephalic and atraumatic.  Eyes:     General: No scleral icterus.    Conjunctiva/sclera: Conjunctivae normal.  Cardiovascular:     Rate and Rhythm: Regular rhythm. Tachycardia present.     Pulses: Normal pulses.  Pulmonary:      Effort: Pulmonary effort is normal. No respiratory distress.     Breath sounds: No rhonchi or rales.     Comments: Very faint, scattered expiratory wheeze. Chest expansion symmetric. SpO2 97% on room air. Abdominal:     General: There is no distension.  Musculoskeletal:        General: Normal range of motion.     Cervical back: Normal range of motion.  Skin:    General: Skin is warm and dry.     Coloration: Skin is not pale.     Findings: No erythema or rash.  Neurological:     Mental Status: She is alert and oriented to person, place, and time.     Coordination: Coordination normal.  Psychiatric:        Behavior: Behavior normal.    ED Results / Procedures / Treatments   Labs (all labs ordered are listed, but only abnormal results are displayed) Labs Reviewed  RESP PANEL BY RT-PCR (FLU A&B, COVID) ARPGX2 - Abnormal; Notable for the following components:      Result Value   Influenza A by PCR POSITIVE (*)    All other components within normal limits  BASIC METABOLIC PANEL - Abnormal; Notable for the following components:   CO2 16 (*)    Glucose, Bld 100 (*)    All other components within normal limits  CBC WITH DIFFERENTIAL/PLATELET - Abnormal; Notable for the following components:   RBC 5.69 (*)    Hemoglobin 15.5 (*)    HCT 46.4 (*)    Lymphs Abs 0.4 (*)    All other components within normal limits  URINALYSIS, ROUTINE W REFLEX MICROSCOPIC - Abnormal; Notable for the following components:   Color, Urine STRAW (*)    Hgb urine dipstick SMALL (*)    Ketones, ur 20 (*)    All other components within normal limits  PREGNANCY, URINE  I-STAT BETA HCG BLOOD, ED ([facility], [facility], [facility] ONLY)    EKG EKG Interpretation  Date/Time:  Saturday March 19 2021 22:05:51 EST Ventricular Rate:  122 PR Interval:  192 QRS Duration: 61 QT Interval:  328 QTC Calculation: 468 R Axis:   78 Text Interpretation: Sinus tachycardia Borderline prolonged PR interval LAE, consider biatrial  enlargement Borderline T abnormalities, inferior leads Confirmed by Gloris Manchester [phone]) on 03/19/2021 10:08:12 PM  Radiology DG Chest 2 View  Result Date:  03/19/2021 CLINICAL DATA:  Cough, shortness of breath and hypoxia. Fever and chills. EXAM: CHEST - 2 VIEW COMPARISON:  Radiograph 03/28/2018 FINDINGS: Chronic hyperinflation and blunting of the costophrenic angles. No focal airspace disease. Heart is normal in size with normal mediastinal contours. No pneumothorax or pneumomediastinum. No pulmonary edema. No acute osseous abnormalities. IMPRESSION: Chronic hyperinflation without acute chest finding. Electronically Signed   By: Narda Rutherford M.D.   On: 03/19/2021 22:38    Procedures Procedures   Medications Ordered in ED Medications  ondansetron (ZOFRAN) injection 4 mg (4 mg Intravenous Patient Refused/Not Given 03/19/21 2309)  acetaminophen (TYLENOL) tablet 650 mg (650 mg Oral Given 03/19/21 1510)  acetaminophen (TYLENOL) tablet 650 mg (650 mg Oral Given 03/19/21 2221)  lactated ringers bolus 2,000 mL (0 mLs Intravenous Stopped 03/20/21 0053)  dexamethasone (DECADRON) tablet 10 mg (10 mg Oral Given 03/19/21 2303)  methylPREDNISolone sodium succinate (SOLU-MEDROL) 125 mg/2 mL injection 125 mg (125 mg Intravenous Given 03/19/21 2303)  ketorolac (TORADOL) 15 MG/ML injection 15 mg (15 mg Intravenous Given 03/19/21 2303)  albuterol (PROVENTIL) (2.5 MG/3ML) 0.083% nebulizer solution 5 mg (5 mg Nebulization Given 03/19/21 2304)  ipratropium (ATROVENT) nebulizer solution 0.5 mg (0.5 mg Nebulization Given 03/19/21 2304)  acetaminophen (TYLENOL) tablet 650 mg (650 mg Oral Given 03/20/21 0155)    ED Course  I have reviewed the triage vital signs and the nursing notes.  Pertinent labs & imaging results that were available during my care of the patient were reviewed by me and considered in my medical decision making (see chart for details).  Clinical Course as of 03/20/21 0342  Sun Mar 20, 2021   0137 HR 106bpm upon entering room for reassessment. SpO2 97%. Patient states she is feeling much better.  Her lungs are clear to auscultation.  Chest x-ray does not show any acute cardiopulmonary process.  Her symptoms are consistent with diagnosis of influenza A.  Given Decadron in the ED given asthma history. Patient denies need for additional albuterol Rx. [KH]    Clinical Course User Index [KH] Antony Madura, PA-C   MDM Rules/Calculators/A&P                           Patient with symptoms consistent with influenza; tested positive for Flu A in the ED today.  Initially febrile.  This has defervesced with antipyretics.  Complains primarily of chest tightness.  Known asthmatic.  Has had symptomatic improvement with albuterol/Atrovent DuoNeb.  She has not had any hypoxia while in the ED.  On reassessment, states she is breathing at baseline.  Her chest x-ray is negative for other acute cardiopulmonary abnormality.  Given Solu-Medrol and Decadron in the emergency department.  Patient will be discharged with instructions to orally hydrate, rest, and use over-the-counter medications such as anti-inflammatories ibuprofen and Aleve for muscle aches and Tylenol for fever.  Encouraged follow-up with a primary care doctor.  Return precautions discussed and provided. Patient discharged in stable condition with no unaddressed concerns.   Final Clinical Impression(s) / ED Diagnoses Final diagnoses:  Influenza A  Acute bronchospasm due to viral infection    Rx / DC Orders ED Discharge Orders     None        Antony Madura, PA-C 03/20/21 0344    Shon Baton, MD 03/21/21 [phone]

## 2021-03-20 LAB — URINALYSIS, ROUTINE W REFLEX MICROSCOPIC
Bacteria, UA: NONE SEEN
Bilirubin Urine: NEGATIVE
Glucose, UA: NEGATIVE mg/dL
Ketones, ur: 20 mg/dL — AB
Leukocytes,Ua: NEGATIVE
Nitrite: NEGATIVE
Protein, ur: NEGATIVE mg/dL
Specific Gravity, Urine: 1.005 (ref 1.005–1.030)
pH: 5 (ref 5.0–8.0)

## 2021-03-20 LAB — PREGNANCY, URINE: Preg Test, Ur: NEGATIVE

## 2021-03-20 MED ORDER — ACETAMINOPHEN 325 MG PO TABS
650.0000 mg | ORAL_TABLET | Freq: Once | ORAL | Status: AC
  Administered 2021-03-20: 650 mg via ORAL
  Filled 2021-03-20: qty 2

## 2021-03-20 NOTE — Discharge Instructions (Signed)
You have tested positive for the flu today. Take 650mg  tylenol every 4-6 hours for fever. Use ibuprofen for headaches, body aches. Drink plenty of fluids to prevent dehydration. You may continue to use other over-the-counter remedies for symptom control, if desired. Use 2 puffs of your inhaler every 4-6 hours for symptoms of wheezing, chest tightness, shortness of breath. Return for new or concerning symptoms such as worsening shortness of breath, coughing up blood, persistent vomiting, loss of consciousness.

## 2021-03-22 ENCOUNTER — Ambulatory Visit: Payer: Self-pay

## 2021-03-22 ENCOUNTER — Emergency Department (HOSPITAL_COMMUNITY)
Admission: EM | Admit: 2021-03-22 | Discharge: 2021-03-23 | Disposition: A | Discharge status: Home / Self Care | Payer: Self-pay | Attending: Emergency Medicine | Admitting: Emergency Medicine

## 2021-03-22 ENCOUNTER — Encounter (HOSPITAL_COMMUNITY): Payer: Self-pay

## 2021-03-22 DIAGNOSIS — R1031 Right lower quadrant pain: Secondary | ICD-10-CM | POA: Insufficient documentation

## 2021-03-22 DIAGNOSIS — J111 Influenza due to unidentified influenza virus with other respiratory manifestations: Secondary | ICD-10-CM | POA: Insufficient documentation

## 2021-03-22 DIAGNOSIS — R102 Pelvic and perineal pain: Secondary | ICD-10-CM | POA: Insufficient documentation

## 2021-03-22 DIAGNOSIS — I10 Essential (primary) hypertension: Secondary | ICD-10-CM | POA: Insufficient documentation

## 2021-03-22 DIAGNOSIS — J45909 Unspecified asthma, uncomplicated: Secondary | ICD-10-CM | POA: Insufficient documentation

## 2021-03-22 LAB — URINALYSIS, ROUTINE W REFLEX MICROSCOPIC
Bacteria, UA: NONE SEEN
Bilirubin Urine: NEGATIVE
Glucose, UA: NEGATIVE mg/dL
Ketones, ur: 20 mg/dL — AB
Leukocytes,Ua: NEGATIVE
Nitrite: NEGATIVE
Protein, ur: NEGATIVE mg/dL
Specific Gravity, Urine: 1.015 (ref 1.005–1.030)
pH: 5 (ref 5.0–8.0)

## 2021-03-22 LAB — COMPREHENSIVE METABOLIC PANEL
ALT: 28 U/L (ref 0–44)
AST: 42 U/L — ABNORMAL HIGH (ref 15–41)
Albumin: 3.7 g/dL (ref 3.5–5.0)
Alkaline Phosphatase: 41 U/L (ref 38–126)
Anion gap: 10 (ref 5–15)
BUN: 8 mg/dL (ref 6–20)
CO2: 24 mmol/L (ref 22–32)
Calcium: 8.7 mg/dL — ABNORMAL LOW (ref 8.9–10.3)
Chloride: 101 mmol/L (ref 98–111)
Creatinine, Ser: 0.92 mg/dL (ref 0.44–1.00)
GFR, Estimated: >60 mL/min (ref >60)
Glucose, Bld: 87 mg/dL (ref 70–99)
Potassium: 3.8 mmol/L (ref 3.5–5.1)
Sodium: 135 mmol/L (ref 135–145)
Total Bilirubin: 0.7 mg/dL (ref 0.3–1.2)
Total Protein: 7.3 g/dL (ref 6.5–8.1)

## 2021-03-22 LAB — CBC
HCT: 46.1 % — ABNORMAL HIGH (ref 36.0–46.0)
Hemoglobin: 14.6 g/dL (ref 12.0–15.0)
MCH: 26.2 pg (ref 26.0–34.0)
MCHC: 31.7 g/dL (ref 30.0–36.0)
MCV: 82.6 fL (ref 80.0–100.0)
Platelets: 189 10*3/uL (ref 150–400)
RBC: 5.58 MIL/uL — ABNORMAL HIGH (ref 3.87–5.11)
RDW: 14.4 % (ref 11.5–15.5)
WBC: 2.3 10*3/uL — ABNORMAL LOW (ref 4.0–10.5)
nRBC: 0 % (ref 0.0–0.2)

## 2021-03-22 LAB — LIPASE, BLOOD: Lipase: 28 U/L (ref 11–51)

## 2021-03-22 LAB — I-STAT BETA HCG BLOOD, ED (MC, WL, AP ONLY): I-stat hCG, quantitative: <5 m[IU]/mL (ref <5)

## 2021-03-22 MED ORDER — ONDANSETRON 4 MG PO TBDP
4.0000 mg | ORAL_TABLET | Freq: Once | ORAL | Status: AC | PRN
  Administered 2021-03-22: 4 mg via ORAL
  Filled 2021-03-22: qty 1

## 2021-03-22 MED ORDER — ACETAMINOPHEN 325 MG PO TABS
650.0000 mg | ORAL_TABLET | Freq: Once | ORAL | Status: AC
  Administered 2021-03-22: 650 mg via ORAL
  Filled 2021-03-22: qty 2

## 2021-03-22 NOTE — ED Triage Notes (Signed)
Pt reports testing pos for flu three days ago. Today she started noticing RLQ abdominal pain. N/V/D prior to arrival. Pt unrelieved by tylenol at home. Pt reports menses began yesterday.

## 2021-03-23 ENCOUNTER — Emergency Department (HOSPITAL_COMMUNITY): Payer: Self-pay

## 2021-03-23 LAB — GC/CHLAMYDIA PROBE AMP (~~LOC~~) NOT AT ARMC
Chlamydia: NEGATIVE
Comment: NEGATIVE
Comment: NORMAL
Neisseria Gonorrhea: NEGATIVE

## 2021-03-23 LAB — WET PREP, GENITAL
Clue Cells Wet Prep HPF POC: NONE SEEN
Sperm: NONE SEEN
Trich, Wet Prep: NONE SEEN
WBC, Wet Prep HPF POC: <10 (ref <10)
Yeast Wet Prep HPF POC: NONE SEEN

## 2021-03-23 MED ORDER — NAPROXEN 500 MG PO TABS: 500.0000 mg | ORAL_TABLET | Freq: Two times a day (BID) | ORAL | 0 refills | Status: DC

## 2021-03-23 MED ORDER — ACETAMINOPHEN 500 MG PO TABS
1000.0000 mg | ORAL_TABLET | Freq: Once | ORAL | Status: AC
  Administered 2021-03-23: 1000 mg via ORAL
  Filled 2021-03-23: qty 2

## 2021-03-23 MED ORDER — OXYCODONE HCL 5 MG PO TABS
5.0000 mg | ORAL_TABLET | Freq: Once | ORAL | Status: AC
  Administered 2021-03-23: 5 mg via ORAL
  Filled 2021-03-23: qty 1

## 2021-03-23 MED ORDER — DOXYCYCLINE HYCLATE 100 MG PO CAPS: 100.0000 mg | ORAL_CAPSULE | Freq: Two times a day (BID) | ORAL | 0 refills | Status: AC

## 2021-03-23 MED ORDER — CEFTRIAXONE SODIUM 500 MG IJ SOLR
500.0000 mg | Freq: Once | INTRAMUSCULAR | Status: AC
  Administered 2021-03-23: 500 mg via INTRAMUSCULAR
  Filled 2021-03-23: qty 500

## 2021-03-23 MED ORDER — IOHEXOL 300 MG/ML  SOLN
80.0000 mL | Freq: Once | INTRAMUSCULAR | Status: AC | PRN
  Administered 2021-03-23: 80 mL via INTRAVENOUS

## 2021-03-23 MED ORDER — STERILE WATER FOR INJECTION IJ SOLN
INTRAMUSCULAR | Status: AC
  Filled 2021-03-23: qty 10

## 2021-03-23 NOTE — ED Provider Notes (Signed)
[facility]-EMERGENCY DEPT [facility] Emergency Department Provider Note MRN:  [id number]  Arrival date & time: 03/23/21     Chief Complaint   Abdominal Pain   History of Present Illness   Christy Little is a 30 y.o. year-old female with no pertinent past medical history presenting to the ED with chief complaint of abdominal pain.  Location: Right lower quadrant Duration: 1 day Onset: Gradual Timing: Constant Description: Sharp Severity: Severe Exacerbating/Alleviating Factors: Worse with coughing Associated Symptoms: Vaginal bleeding (on her period) also recently diagnosed with influenza has been having cough and fever Pertinent Negatives: No chest pain or shortness of breath, no dysuria, no vaginal discharge  Additional History: None  Review of Systems  A complete 10 system review of systems was obtained and all systems are negative except as noted in the HPI and PMH.   Patient's Health History    Past Medical History:  Diagnosis Date   Anxiety    Asthma    Constipation    Gastritis, acute    History of trichomoniasis    HSV infection    pt states she started having cold sores in October   Hypertension    IBS (irritable bowel syndrome)    Kidney stones 10/2011   Sinus infection     Past Surgical History:  Procedure Laterality Date   COLONOSCOPY      Family History  Problem Relation Age of Onset   Uterine cancer Mother    Asthma Father    Breast cancer Maternal Aunt    Breast cancer Maternal Great-grandmother     Social History   Socioeconomic History   Marital status: Single    Spouse name: Not on file   Number of children: 0   Years of education: Not on file   Highest education level: Associate degree: occupational, Scientist, product/process development, or vocational program  Occupational History   Occupation: student  Tobacco Use   Smoking status: Never   Smokeless tobacco: Never  Vaping Use   Vaping Use: Never used  Substance and Sexual Activity   Alcohol use: Yes     Comment: Once every two weeks.    Drug use: Yes    Frequency: 1.0 times per week    Types: Marijuana    Comment: Everyday.    Sexual activity: Yes    Birth control/protection: Condom  Other Topics Concern   Not on file  Social History Narrative   Not on file   Social Determinants of Health   Financial Resource Strain: Not on file  Food Insecurity: Not on file  Transportation Needs: Not on file  Physical Activity: Not on file  Stress: Not on file  Social Connections: Not on file  Intimate Partner Violence: Not on file     Physical Exam   Vitals:   03/23/21 0400 03/23/21 0448  BP: 126/88   Pulse: (!) 103   Resp: 16   Temp:  98.8 F (37.1 C)  SpO2: 98%     CONSTITUTIONAL: Well-appearing, NAD NEURO:  Alert and oriented x 3, no focal deficits EYES:  eyes equal and reactive ENT/NECK:  no LAD, no JVD CARDIO: Regular rate, well-perfused, normal S1 and S2 PULM:  CTAB no wheezing or rhonchi GI/GU:  normal bowel sounds, non-distended, non-tender MSK/SPINE:  No gross deformities, no edema SKIN:  no rash, atraumatic PSYCH:  Appropriate speech and behavior  *Additional and/or pertinent findings included in MDM below  Diagnostic and Interventional Summary    EKG Interpretation  Date/Time:    Ventricular  Rate:    PR Interval:    QRS Duration:   QT Interval:    QTC Calculation:   R Axis:     Text Interpretation:         Labs Reviewed  COMPREHENSIVE METABOLIC PANEL - Abnormal; Notable for the following components:      Result Value   Calcium 8.7 (*)    AST 42 (*)    All other components within normal limits  CBC - Abnormal; Notable for the following components:   WBC 2.3 (*)    RBC 5.58 (*)    HCT 46.1 (*)    All other components within normal limits  URINALYSIS, ROUTINE W REFLEX MICROSCOPIC - Abnormal; Notable for the following components:   Hgb urine dipstick LARGE (*)    Ketones, ur 20 (*)    All other components within normal limits  WET PREP, GENITAL   LIPASE, BLOOD  I-STAT BETA HCG BLOOD, ED ([facility], [facility], [facility] ONLY)  GC/CHLAMYDIA PROBE AMP ([place]) NOT AT [facility]    CT ABDOMEN PELVIS W CONTRAST  Final Result      Medications  cefTRIAXone (ROCEPHIN) injection 500 mg (has no administration in time range)  ondansetron (ZOFRAN-ODT) disintegrating tablet 4 mg (4 mg Oral Given 03/22/21 1756)  acetaminophen (TYLENOL) tablet 650 mg (650 mg Oral Given 03/22/21 2232)  iohexol (OMNIPAQUE) 300 MG/ML solution 80 mL (80 mLs Intravenous Contrast Given 03/23/21 0033)  acetaminophen (TYLENOL) tablet 1,000 mg (1,000 mg Oral Given 03/23/21 0341)  oxyCODONE (Oxy IR/ROXICODONE) immediate release tablet 5 mg (5 mg Oral Given 03/23/21 0435)     Procedures  /  Critical Care Procedures  ED Course and Medical Decision Making  I have reviewed the triage vital signs, the nursing notes, and pertinent available records from the EMR.  Listed above are laboratory and imaging tests that I personally ordered, reviewed, and interpreted and then considered in my medical decision making (see below for details).  Differential diagnosis including appendicitis, abdominal cramping in the setting of menstrual cycle, also considering PID given patient's history of HSV and trichomonas infection in her documentation.  CT is reassuring, pelvic exam to follow.     Pelvic exam: Normal-appearing external genitalia with no lymphadenopathy.  Upon bimanual exam there is very localized and significant right adnexal tenderness.  Exam with speculum is overall unrevealing, some blood in the vaginal vault.  Given the bimanual exam and the recent fever and continued pain, as well as patient history of STI, will treat for PID.  Patient has normal vital signs at this time, pain is adequately controlled, appropriate for discharge.  Elmer Sow. Pilar Plate, MD [facility] Emergency Medicine [facility] [email or web address]  Final Clinical Impressions(s) / ED Diagnoses     ICD-10-CM    1. Influenza  J11.1     2. Right lower quadrant abdominal pain  R10.31       ED Discharge Orders          Ordered    naproxen (NAPROSYN) 500 MG tablet  2 times daily        03/23/21 0416    doxycycline (VIBRAMYCIN) 100 MG capsule  2 times daily        03/23/21 0509             Discharge Instructions Discussed with and Provided to Patient:    Discharge Instructions      You were evaluated in the Emergency Department and after careful evaluation, we did not find any emergent  condition requiring admission or further testing in the hospital.  Your exam/testing today was overall reassuring.  We are treating you for possible pelvic inflammatory disease.  Take the doxycycline antibiotic as directed.  Use the Naprosyn anti-inflammatory as directed for pain.  Please return to the Emergency Department if you experience any worsening of your condition.  Thank you for allowing [place] to be a part of your care.        Sabas Sous, MD 03/23/21 [phone]

## 2021-03-23 NOTE — Discharge Instructions (Addendum)
You were evaluated in the Emergency Department and after careful evaluation, we did not find any emergent condition requiring admission or further testing in the hospital.  Your exam/testing today was overall reassuring.  We are treating you for possible pelvic inflammatory disease.  Take the doxycycline antibiotic as directed.  Use the Naprosyn anti-inflammatory as directed for pain.  Please return to the Emergency Department if you experience any worsening of your condition.  Thank you for allowing [place] to be a part of your care.

## 2021-03-24 ENCOUNTER — Other Ambulatory Visit: Payer: Self-pay

## 2021-03-24 ENCOUNTER — Emergency Department
Admission: RE | Admit: 2021-03-24 | Discharge: 2021-03-24 | Disposition: A | Discharge status: Home / Self Care | Payer: Self-pay | Source: Ambulatory Visit | Attending: Family Medicine | Admitting: Family Medicine

## 2021-03-24 VITALS — BP 127/90 | HR 98 | Temp 99.4°F | Resp 12

## 2021-03-24 DIAGNOSIS — J111 Influenza due to unidentified influenza virus with other respiratory manifestations: Secondary | ICD-10-CM

## 2021-03-24 DIAGNOSIS — R102 Pelvic and perineal pain: Secondary | ICD-10-CM

## 2021-03-24 MED ORDER — ONDANSETRON HCL 4 MG PO TABS: 4.0000 mg | ORAL_TABLET | Freq: Four times a day (QID) | ORAL | 0 refills | Status: DC

## 2021-03-24 MED ORDER — TRAMADOL-ACETAMINOPHEN 37.5-325 MG PO TABS: 1.0000 | ORAL_TABLET | Freq: Four times a day (QID) | ORAL | 0 refills | Status: DC | PRN

## 2021-03-24 NOTE — ED Provider Notes (Signed)
[facility]    CSN: [id number] Arrival date & time: 03/24/21  1153      History   Chief Complaint Chief Complaint  Patient presents with   appt 12   Pelvic Pain    Hx BV   Emesis    Flu positive    HPI Christy Little is a 30 y.o. female.   HPI  Patient went to the ER on 03/19/2021.  She was diagnosed with influenza.  She went back to the emergency room on 03/23/2021.  She had pelvic pain at that visit.  She had blood work, pelvic examination, cultures, urinalysis, and CT abdomen performed.  She was diagnosed with PID.  She was treated with a shot of Rocephin and doxycycline for 1 week. Patient is here today because she continues to have vomiting.  She is still coughing.  She is taking the medicines as prescribed.  She tells me that last time she had PID it was caused by BV.  She wonders why she has not given a prescription for BV.  She states she is allergic to metronidazole.  Is uncertain what medicine she can take for BV. I reviewed the medical record.  They did do a wet prep.  It was negative for clue cells.  They did chlamydia and gonorrhea testing.  These were both negative.  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   Hypertension 07/03/2016   Anxiety and depression 07/03/2016    Past Surgical History:  Procedure Laterality Date   COLONOSCOPY      OB History     Gravida  0   Para  0   Term  0   Preterm  0   AB  0   Living  0      SAB  0   IAB  0   Ectopic  0   Multiple  0   Live Births  0            Home Medications    Prior to Admission medications   Medication Sig  Start Date End Date Taking? Authorizing Provider  ondansetron (ZOFRAN) 4 MG tablet Take 1-2 tablets (4-8 mg total) by mouth every 6 (six) hours. As needed nausea 03/24/21  Yes Eustace Moore, MD  traMADol-acetaminophen (ULTRACET) 37.5-325 MG tablet Take 1-2 tablets by mouth every 6 (six) hours as needed. 03/24/21  Yes Eustace Moore, MD  albuterol (VENTOLIN HFA) 108 (90 Base) MCG/ACT inhaler Inhale 1-2 puffs into the lungs every 6 (six) hours as needed for wheezing or shortness of breath. 09/15/20   Eber Hong, MD  doxycycline (VIBRAMYCIN) 100 MG capsule Take 1 capsule (100 mg total) by mouth 2 (two) times daily for 14 days. 03/23/21 04/06/21  Sabas Sous, MD  naproxen (NAPROSYN) 500 MG tablet Take 1 tablet (500 mg total) by mouth 2 (two) times daily. 03/23/21   Sabas Sous, MD  cetirizine (ZYRTEC ALLERGY) 10 MG tablet Take 1 tablet (10 mg total) by mouth daily. 03/28/18 04/07/20  Dahlia Client,  Molly Maduro PA-C    Family History Family History  Problem Relation Age of Onset   Uterine cancer Mother    Asthma Father    Breast cancer Maternal Aunt    Breast cancer Maternal Great-grandmother     Social History Social History   Tobacco Use   Smoking status: Never   Smokeless tobacco: Never  Vaping Use   Vaping Use: Never used  Substance Use Topics   Alcohol use: Yes    Comment: Once every two weeks.    Drug use: Yes    Frequency: 1.0 times per week    Types: Marijuana    Comment: Everyday.      Allergies   Amoxicillin and Flagyl [metronidazole]   Review of Systems Review of Systems See HPI  Physical Exam Triage Vital Signs ED Triage Vitals  Enc Vitals Group     BP 03/24/21 1217 127/90     Pulse Rate 03/24/21 1217 98     Resp 03/24/21 1217 12     Temp 03/24/21 1217 99.4 F (37.4 C)     Temp Source 03/24/21 1217 Oral     SpO2 03/24/21 1217 96 %     Weight --      Height --      Head Circumference --      Peak Flow --      Pain Score 03/24/21 1218 2      Pain Loc --      Pain Edu? --      Excl. in [place]? --    No data found.  Updated Vital Signs BP 127/90 (BP Location: Left Arm)   Pulse 98   Temp 99.4 F (37.4 C) (Oral)   Resp 12   LMP 02/24/2021 Comment: pt shielded  SpO2 96%       Physical Exam Constitutional:      General: She is not in acute distress.    Appearance: She is well-developed. She is ill-appearing.  HENT:     Head: Normocephalic and atraumatic.     Nose:     Comments: Mask is in place Eyes:     Conjunctiva/sclera: Conjunctivae normal.     Pupils: Pupils are equal, round, and reactive to light.  Cardiovascular:     Rate and Rhythm: Normal rate and regular rhythm.     Heart sounds: Normal heart sounds.  Pulmonary:     Effort: Pulmonary effort is normal. No respiratory distress.     Comments: Coughing.  Lungs are clear Abdominal:     General: Bowel sounds are normal. There is no distension.     Palpations: Abdomen is soft.     Tenderness: There is no abdominal tenderness.  Musculoskeletal:        General: Normal range of motion.     Cervical back: Normal range of motion.  Skin:    General: Skin is warm and dry.  Neurological:     Mental Status: She is alert.  Psychiatric:        Mood and Affect: Mood normal.        Behavior: Behavior normal.     UC Treatments / Results  Labs (all labs ordered are listed, but only abnormal results are displayed) Labs Reviewed - No data to display  EKG   Radiology CT ABDOMEN PELVIS W CONTRAST  Result Date: 03/23/2021 CLINICAL DATA:  Right lower quadrant abdominal pain. EXAM: CT ABDOMEN AND PELVIS WITH CONTRAST TECHNIQUE: Multidetector CT imaging of the abdomen and pelvis was performed using  the standard protocol following bolus administration of intravenous contrast. CONTRAST:  70mL OMNIPAQUE IOHEXOL 300 MG/ML  SOLN COMPARISON:  None. FINDINGS: Lower chest: Lung bases are clear. Hepatobiliary: Subcentimeter low-density focus in the left hepatic lobe is too small  to characterize but likely small cyst. No suspicious liver lesion. Gallbladder physiologically distended, no calcified stone. No biliary dilatation. Pancreas: No ductal dilatation or inflammation. Spleen: Normal in size without focal abnormality. Adrenals/Urinary Tract: Adrenal glands are unremarkable. Kidneys are normal, without focal lesion or hydronephrosis. Excreted IV contrast in the renal collecting systems limits assessment for renal calculi. Bladder is unremarkable. Stomach/Bowel: Bowel assessment is limited in the absence of enteric contrast and paucity of intra-abdominal fat. The stomach is nondistended. There is no small bowel obstruction. No small bowel inflammatory change. Normal appendix identified with moderate certainty, series 6, image 48. There is no evidence of appendicitis. Majority of the colon is nondistended which limits assessment. No colonic inflammatory change. Vascular/Lymphatic: No acute vascular findings. Normal caliber abdominal aorta. Patent portal vein. No portal venous or mesenteric gas. There is no bulky abdominopelvic adenopathy. Reproductive: Retroverted uterus.  No adnexal mass. Other: No free air or free fluid. No focal fluid collection. No abdominal wall hernia. Musculoskeletal: There are no acute or suspicious osseous abnormalities. IMPRESSION: No acute abnormality or explanation for abdominal pain. Normal appendix. Electronically Signed   By: Narda Rutherford M.D.   On: 03/23/2021 00:45    Procedures Procedures (including critical care time)  Medications Ordered in UC Medications - No data to display  Initial Impression / Assessment and Plan / UC Course  I have reviewed the triage vital signs and the nursing notes.  Pertinent labs & imaging results that were available during my care of the patient were reviewed by me and considered in my medical decision making (see chart for details).     Patient is argumentative that she needs to be on BV medication.  I told  her that BV testing was done and it was negative.  I am not going to place her on clindamycin based on hernSuspicion with no culture report to document.  I told her that doxycycline and Rocephin were the correct medical treatment for PID.  She then gets her father on the phone.  The father challenges me why additional testing was not done.  Questions why whether she could have a ovarian cyst.  I explained to the father that he needed negotiate with his daughter, who is an adult, appropriate medical care. Final Clinical Impressions(s) / UC Diagnoses   Final diagnoses:  Pelvic pain  Influenza     Discharge Instructions      Take Zofran for nausea and vomiting Take Ultracet as needed for pain Finish the doxycycline antibiotic 2 times a day for a week The emergency room testing for BV was negative.  I do not see any reason to change the antibiotic. Follow-up with GYN as needed   ED Prescriptions     Medication Sig Dispense Auth. Provider   traMADol-acetaminophen (ULTRACET) 37.5-325 MG tablet Take 1-2 tablets by mouth every 6 (six) hours as needed. 20 tablet Eustace Moore, MD   ondansetron (ZOFRAN) 4 MG tablet Take 1-2 tablets (4-8 mg total) by mouth every 6 (six) hours. As needed nausea 12 tablet Eustace Moore, MD      I have reviewed the PDMP during this encounter.   Eustace Moore, MD 03/24/21 [phone]

## 2021-03-24 NOTE — ED Triage Notes (Signed)
Pt presents with pelvic pain that began Sunday and emesis. Pt tested positive for flu on 11/15. Pt seen in ED for pelvic pain on 11/16 and was rx'd doxy for PID

## 2021-03-24 NOTE — Discharge Instructions (Signed)
Take Zofran for nausea and vomiting Take Ultracet as needed for pain Finish the doxycycline antibiotic 2 times a day for a week The emergency room testing for BV was negative.  I do not see any reason to change the antibiotic. Follow-up with GYN as needed

## 2021-07-19 ENCOUNTER — Ambulatory Visit: Payer: Self-pay

## 2021-08-30 ENCOUNTER — Telehealth: Payer: Self-pay | Admitting: Family Medicine

## 2021-08-30 NOTE — Progress Notes (Signed)
The patient no-showed for appointment despite this provider sending direct link, reaching out via phone with no response and waiting for at least 10 minutes from appointment time for patient to join. They will be marked as a NS for this appointment/time.   Kery Batzel M Clavin Ruhlman, NP    

## 2021-09-16 ENCOUNTER — Ambulatory Visit: Payer: BC Managed Care – PPO

## 2021-09-19 ENCOUNTER — Ambulatory Visit
Admission: EM | Admit: 2021-09-19 | Discharge: 2021-09-19 | Disposition: A | Discharge status: Home / Self Care | Payer: BC Managed Care – PPO | Attending: Family Medicine | Admitting: Family Medicine

## 2021-09-19 DIAGNOSIS — N760 Acute vaginitis: Secondary | ICD-10-CM | POA: Diagnosis not present

## 2021-09-19 DIAGNOSIS — Z113 Encounter for screening for infections with a predominantly sexual mode of transmission: Secondary | ICD-10-CM | POA: Insufficient documentation

## 2021-09-19 NOTE — ED Triage Notes (Addendum)
Greater than one week h/o vaginal discharge, itching and irritation. No meds taken. Pt requesting STI testing including blood work. ?

## 2021-09-19 NOTE — Discharge Instructions (Signed)
Staff will call you with any positives. ? ? ?

## 2021-09-19 NOTE — ED Provider Notes (Signed)
?[facility] ? ? ? ?CSN: [id number] ?Arrival date & time: 09/19/21  1112 ? ? ?  ? ?History   ?Chief Complaint ?Chief Complaint  ?Patient presents with  ? Vaginal Discharge  ? ? ?HPI ?Christy Little is a 31 y.o. female.  ? ? ?Vaginal Discharge ?Here for vaginal dc for the last few days. No abd pain or n/v.  ? 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 Does not feel like having a current outbreak.  ? ?Requests screening STD lab/bloodwork ? ?Past Medical History:  ?Diagnosis Date  ? Anxiety   ? Asthma   ? Constipation   ? Gastritis, acute   ? History of trichomoniasis   ? HSV infection   ? pt states she started having cold sores in October  ? Hypertension   ? IBS (irritable bowel syndrome)   ? Kidney stones 10/2011  ? Sinus infection   ? ? ?Patient Active Problem List  ? Diagnosis Date Noted  ? Dysplasia of cervix, low grade (CIN 1) 05/12/2020  ? Hypertension 07/03/2016  ? Anxiety and depression 07/03/2016  ? ? ?Past Surgical History:  ?Procedure Laterality Date  ? COLONOSCOPY    ? ? ?OB History   ? ? Gravida  ?0  ? Para  ?0  ? Term  ?0  ? Preterm  ?0  ? AB  ?0  ? Living  ?0  ?  ? ? SAB  ?0  ? IAB  ?0  ? Ectopic  ?0  ? Multiple  ?0  ? Live Births  ?0  ?   ?  ?  ? ? ? ?Home Medications   ? ?Prior to Admission medications   ?Medication Sig Start Date End Date Taking? Authorizing Provider  ?albuterol (VENTOLIN HFA) 108 (90 Base) MCG/ACT inhaler Inhale 1-2 puffs into the lungs every 6 (six) hours as needed for wheezing or shortness of breath. 09/15/20   Noemi Chapel, MD  ?ondansetron (ZOFRAN) 4 MG tablet Take 1-2 tablets (4-8 mg total) by mouth every 6 (six) hours. As needed nausea 03/24/21   Raylene Everts, MD  ?cetirizine (ZYRTEC ALLERGY) 10 MG tablet Take 1 tablet (10 mg total) by mouth daily. 03/28/18 04/07/20  Montine Circle, PA-C  ? ? ?Family History ?Family History  ?Problem Relation Age of Onset  ? Uterine cancer Mother   ? Asthma Father   ? Breast cancer Maternal Aunt   ? Breast cancer Maternal  Great-grandmother   ? ? ?Social History ?Social History  ? ?Tobacco Use  ? Smoking status: Never  ? Smokeless tobacco: Never  ?Vaping Use  ? Vaping Use: Never used  ?Substance Use Topics  ? Alcohol use: Yes  ?  Comment: Once every two weeks.   ? Drug use: Yes  ?  Frequency: 1.0 times per week  ?  Types: Marijuana  ?  Comment: Everyday.   ? ? ? ?Allergies   ?Amoxicillin and Flagyl [metronidazole] ? ? ?Review of Systems ?Review of Systems  ?Genitourinary:  Positive for vaginal discharge.  ? ? ?Physical Exam ?Triage Vital Signs ?ED Triage Vitals [09/19/21 1300]  ?Enc Vitals Group  ?   BP 115/76  ?   Pulse Rate 70  ?   Resp 18  ?   Temp (!) 97.3 ?F (36.3 ?C)  ?   Temp Source Oral  ?   SpO2 99 %  ?   Weight   ?   Height   ?   Head Circumference   ?  Peak Flow   ?   Pain Score 0  ?   Pain Loc   ?   Pain Edu?   ?   Excl. in [place]?   ? ?No data found. ? ?Updated Vital Signs ?BP 115/76 (BP Location: Right Arm)   Pulse 70   Temp (!) 97.3 ?F (36.3 ?C) (Oral)   Resp 18   LMP 09/12/2021 (Approximate)   SpO2 99%  ? ?Visual Acuity ?Right Eye Distance:   ?Left Eye Distance:   ?Bilateral Distance:   ? ?Right Eye Near:   ?Left Eye Near:    ?Bilateral Near:    ? ?Physical Exam ?Vitals reviewed.  ?Constitutional:   ?   General: She is not in acute distress. ?   Appearance: She is not ill-appearing, toxic-appearing or diaphoretic.  ?Cardiovascular:  ?   Rate and Rhythm: Normal rate and regular rhythm.  ?Pulmonary:  ?   Effort: Pulmonary effort is normal.  ?   Breath sounds: Normal breath sounds.  ?Abdominal:  ?   Palpations: Abdomen is soft.  ?   Tenderness: There is no abdominal tenderness.  ?Skin: ?   Coloration: Skin is not pale.  ?Neurological:  ?   Mental Status: She is alert and oriented to person, place, and time.  ?Psychiatric:     ?   Behavior: Behavior normal.  ? ? ? ?UC Treatments / Results  ?Labs ?(all labs ordered are listed, but only abnormal results are displayed) ?Labs Reviewed  ?HIV ANTIBODY (ROUTINE TESTING W  REFLEX)  ?RPR  ?CERVICOVAGINAL ANCILLARY ONLY  ? ? ?EKG ? ? ?Radiology ?No results found. ? ?Procedures ?Procedures (including critical care time) ? ?Medications Ordered in UC ?Medications - No data to display ? ?Initial Impression / Assessment and Plan / UC Course  ?I have reviewed the triage vital signs and the nursing notes. ? ?Pertinent labs & imaging results that were available during my care of the patient were reviewed by me and considered in my medical decision making (see chart for details). ? ?  ? ?We will notify her of any positive results, and tx per protocol. Assistance requested to help her find pcp. Discussed potential benefit of daily prophylaxis for hsv outbreak ?Final Clinical Impressions(s) / UC Diagnoses  ? ?Final diagnoses:  ?Screening for STD (sexually transmitted disease)  ?Acute vaginitis  ? ? ? ?Discharge Instructions   ? ?  ?Staff will call you with any positives. ? ? ? ? ? ? ?ED Prescriptions   ?None ?  ? ?PDMP not reviewed this encounter. ?  ?Barrett Henle, MD ?09/19/21 1328 ? ?

## 2021-09-20 ENCOUNTER — Ambulatory Visit: Payer: Self-pay

## 2021-09-20 ENCOUNTER — Telehealth (HOSPITAL_COMMUNITY): Payer: Self-pay | Admitting: Emergency Medicine

## 2021-09-20 LAB — CERVICOVAGINAL ANCILLARY ONLY
Bacterial Vaginitis (gardnerella): POSITIVE — AB
Candida Glabrata: NEGATIVE
Candida Vaginitis: NEGATIVE
Chlamydia: NEGATIVE
Comment: NEGATIVE
Comment: NEGATIVE
Comment: NEGATIVE
Comment: NEGATIVE
Comment: NEGATIVE
Comment: NORMAL
Neisseria Gonorrhea: NEGATIVE
Trichomonas: NEGATIVE

## 2021-09-20 LAB — RPR: RPR Ser Ql: NONREACTIVE

## 2021-09-20 LAB — HIV ANTIBODY (ROUTINE TESTING W REFLEX): HIV Screen 4th Generation wRfx: NONREACTIVE

## 2021-09-20 MED ORDER — CLINDAMYCIN HCL 150 MG PO CAPS: 300.0000 mg | ORAL_CAPSULE | Freq: Two times a day (BID) | ORAL | 0 refills | Status: AC

## 2021-10-28 ENCOUNTER — Ambulatory Visit: Payer: Self-pay

## 2021-10-31 ENCOUNTER — Other Ambulatory Visit: Payer: Self-pay

## 2021-10-31 ENCOUNTER — Encounter: Payer: Self-pay | Admitting: Emergency Medicine

## 2021-10-31 ENCOUNTER — Ambulatory Visit
Admission: EM | Admit: 2021-10-31 | Discharge: 2021-10-31 | Disposition: A | Discharge status: Home / Self Care | Payer: BC Managed Care – PPO | Attending: Emergency Medicine | Admitting: Emergency Medicine

## 2021-10-31 DIAGNOSIS — Z113 Encounter for screening for infections with a predominantly sexual mode of transmission: Secondary | ICD-10-CM | POA: Diagnosis present

## 2021-10-31 DIAGNOSIS — A609 Anogenital herpesviral infection, unspecified: Secondary | ICD-10-CM | POA: Insufficient documentation

## 2021-10-31 LAB — URINALYSIS, ROUTINE W REFLEX MICROSCOPIC
Bilirubin Urine: NEGATIVE
Glucose, UA: NEGATIVE mg/dL
Ketones, ur: NEGATIVE mg/dL
Leukocytes,Ua: NEGATIVE
Nitrite: NEGATIVE
Protein, ur: NEGATIVE mg/dL
Specific Gravity, Urine: 1.025 (ref 1.005–1.030)
pH: 6.5 (ref 5.0–8.0)

## 2021-10-31 LAB — URINALYSIS, MICROSCOPIC (REFLEX)

## 2021-10-31 LAB — WET PREP, GENITAL
Clue Cells Wet Prep HPF POC: NONE SEEN
Sperm: NONE SEEN
Trich, Wet Prep: NONE SEEN
WBC, Wet Prep HPF POC: <10 — AB (ref <10)
Yeast Wet Prep HPF POC: NONE SEEN

## 2021-10-31 LAB — PREGNANCY, URINE: Preg Test, Ur: NEGATIVE

## 2021-10-31 MED ORDER — VALACYCLOVIR HCL 500 MG PO TABS
2000.0000 mg | ORAL_TABLET | Freq: Two times a day (BID) | ORAL | 0 refills | Status: DC
Start: 2021-10-31 — End: 2022-05-30

## 2021-10-31 MED ORDER — VALACYCLOVIR HCL 500 MG PO TABS
500.0000 mg | ORAL_TABLET | Freq: Two times a day (BID) | ORAL | 0 refills | Status: DC
Start: 2021-10-31 — End: 2021-10-31

## 2021-11-01 LAB — CERVICOVAGINAL ANCILLARY ONLY
Chlamydia: NEGATIVE
Comment: NEGATIVE
Comment: NORMAL
Neisseria Gonorrhea: NEGATIVE

## 2021-11-04 ENCOUNTER — Ambulatory Visit: Payer: BC Managed Care – PPO

## 2021-11-07 ENCOUNTER — Other Ambulatory Visit
Admission: RE | Admit: 2021-11-07 | Discharge: 2021-11-07 | Disposition: A | Discharge status: Home / Self Care | Payer: BC Managed Care – PPO | Attending: Emergency Medicine | Admitting: Emergency Medicine

## 2021-11-07 DIAGNOSIS — Z113 Encounter for screening for infections with a predominantly sexual mode of transmission: Secondary | ICD-10-CM | POA: Diagnosis present

## 2021-11-08 LAB — RPR: RPR Ser Ql: NONREACTIVE

## 2021-11-08 LAB — HIV ANTIBODY (ROUTINE TESTING W REFLEX): HIV Screen 4th Generation wRfx: NONREACTIVE

## 2021-12-27 ENCOUNTER — Encounter: Payer: Self-pay | Admitting: Obstetrics and Gynecology

## 2021-12-27 ENCOUNTER — Other Ambulatory Visit (HOSPITAL_COMMUNITY)
Admission: RE | Admit: 2021-12-27 | Discharge: 2021-12-27 | Disposition: A | Discharge status: Home / Self Care | Payer: Medicaid Other | Source: Ambulatory Visit | Attending: Obstetrics and Gynecology | Admitting: Obstetrics and Gynecology

## 2021-12-27 ENCOUNTER — Ambulatory Visit (INDEPENDENT_AMBULATORY_CARE_PROVIDER_SITE_OTHER): Payer: Self-pay | Admitting: Obstetrics and Gynecology

## 2021-12-27 VITALS — BP 116/73 | HR 80 | Ht 62.0 in | Wt 114.0 lb

## 2021-12-27 DIAGNOSIS — N939 Abnormal uterine and vaginal bleeding, unspecified: Secondary | ICD-10-CM

## 2021-12-27 DIAGNOSIS — N898 Other specified noninflammatory disorders of vagina: Secondary | ICD-10-CM

## 2021-12-27 DIAGNOSIS — N879 Dysplasia of cervix uteri, unspecified: Secondary | ICD-10-CM | POA: Insufficient documentation

## 2021-12-27 LAB — CBC
Hematocrit: 44.3 % (ref 34.0–46.6)
Hemoglobin: 14 g/dL (ref 11.1–15.9)
MCH: 26.9 pg (ref 26.6–33.0)
MCHC: 31.6 g/dL (ref 31.5–35.7)
MCV: 85 fL (ref 79–97)
Platelets: 319 10*3/uL (ref 150–450)
RBC: 5.21 x10E6/uL (ref 3.77–5.28)
RDW: 13.7 % (ref 11.7–15.4)
WBC: 6.8 10*3/uL (ref 3.4–10.8)

## 2021-12-27 NOTE — Progress Notes (Unsigned)
Obstetrics and Gynecology New Patient Evaluation  Appointment Date: 12/28/2021  OBGYN Clinic: [facility]   Referring Provider: Urgent care  Chief Complaint: abnormal bleeding, h/o abnormal pap  History of Present Illness: Christy Little is a 31 y.o. G0P0000 (Patient's last menstrual period was 12/19/2021 (exact date).), seen for the above chief complaint. PMHx significant for HTN, CIN 1, HSV, IBS  Patient seen on 6/26 at [facility] for abdominal pain and concern for STD as well as post coital bleeding  Self swab negative there as UPT and u/a and pt referred to GYN  She states that since last year, she's had episodes of irregular spotting in between her periods. She states her periods are qmonth, regular, 7 days and not heavy or painful. She also endorses some post coital spotting and bleeding. She also states that she will sometimes uses boric acid after her periods due to a h/o chronic BV as well as clotrimazole but hasn't used either of those for a few weeks.   She is unsure if she has any spotting halfway b/w her periods but does state sometimes she'll have some spotting or light bleeding right before her full period comes on.   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   Patient Active Problem List   Diagnosis Date Noted   Dysplasia of cervix, low grade (CIN 1) 05/12/2020   Hypertension 07/03/2016   Anxiety and depression 07/03/2016     Past Medical History:  Past Medical History:  Diagnosis Date   Anxiety    Asthma    Constipation    Gastritis, acute    History of trichomoniasis    HSV infection    pt states she started having cold sores in October   Hypertension    IBS  (irritable bowel syndrome)    Kidney stones 10/2011   Sinus infection     Past Surgical History:  Past Surgical History:  Procedure Laterality Date   COLONOSCOPY      Past Obstetrical History:  OB History  Gravida Para Term Preterm AB Living  0 0 0 0 0 0  SAB IAB Ectopic Multiple Live Births  0 0 0 0 0    Past Gynecological History: As per HPI. Contraception: none  Social History:  Social History   Socioeconomic History   Marital status: Single    Spouse name: Not on file   Number of children: 0   Years of education: Not on file   Highest education level: Associate degree: occupational, Scientist, product/process development, or vocational program  Occupational History   Occupation: student  Tobacco Use   Smoking status: Never   Smokeless tobacco: Never  Vaping Use   Vaping Use: Never used  Substance and Sexual Activity   Alcohol use: Yes    Comment: Once every two weeks.    Drug use: Yes    Frequency: 1.0 times per week    Types: Marijuana    Comment: Everyday.    Sexual activity: Yes    Birth control/protection: Condom  Other Topics Concern   Not on file  Social History Narrative   Not on file   Social Determinants of Health   Financial Resource Strain: Not on file  Food Insecurity: Not  on file  Transportation Needs: No Transportation Needs (03/03/2020)   PRAPARE - Administrator, Civil Service (Medical): No    Lack of Transportation (Non-Medical): No  Physical Activity: Not on file  Stress: Not on file  Social Connections: Not on file  Intimate Partner Violence: Not on file    Family History:  Family History  Problem Relation Age of Onset   Uterine cancer Mother    Asthma Father    Breast cancer Maternal Aunt    Breast cancer Maternal Great-grandmother      Medications Kyleigh Scharnhorst had no medications administered during this visit. Current Outpatient Medications  Medication Sig Dispense Refill   albuterol (VENTOLIN HFA) 108 (90 Base) MCG/ACT inhaler  Inhale 1-2 puffs into the lungs every 6 (six) hours as needed for wheezing or shortness of breath. 18 g 0   valACYclovir (VALTREX) 500 MG tablet Take 4 tablets (2,000 mg total) by mouth 2 (two) times daily. 30 tablet 0   No current facility-administered medications for this visit.    Allergies Amoxicillin and Flagyl [metronidazole]   Physical Exam:  BP 116/73   Pulse 80   Ht 5\' 2"  (1.575 m)   Wt 114 lb (51.7 kg)   LMP 12/19/2021 (Exact Date)   BMI 20.85 kg/m  Body mass index is 20.85 kg/m. General appearance: Well nourished, well developed female in no acute distress.  Neck:  Supple, normal appearance, and no thyromegaly  Cardiovascular: normal s1 and s2.  No murmurs, rubs or gallops. Respiratory:  Clear to auscultation bilateral. Normal respiratory effort Abdomen: positive bowel sounds and no masses, hernias; diffusely non tender to palpation, non distended Neuro/Psych:  Normal mood and affect.  Skin:  Warm and dry.  Lymphatic:  No inguinal lymphadenopathy.   Pelvic exam: is not limited by body habitus EGBUS: within normal limits Vagina: within normal limits and with no blood or discharge in the vault Cervix: normal appearing cervix without tenderness, discharge or lesions. Slightly friable Uterus:  nonenlarged and non tender Adnexa:  normal adnexa and no mass, fullness, tenderness Rectovaginal: deferred  Laboratory: none  Radiology: none 03/2021 CT A/P with contrast negative; RV uterus and no adnexal masses seen   Assessment: pt stable  Plan:  1. Abnormal uterine bleeding (AUB) Follow up lab work and early cycle ultrasounds - TSH Rfx on Abnormal to Free T4 - Cytology - PAP( [place]) - CBC - Prolactin - Cervicovaginal ancillary only( [place]) - 04/2021 PELVIC COMPLETE WITH TRANSVAGINAL; Future  2. Cervical dysplasia - Cytology - PAP( [place])  3. Vaginal discharge - Cervicovaginal ancillary only( [place])  RTC after u/s  [place]  MD Attending [facility])

## 2021-12-27 NOTE — Progress Notes (Unsigned)
Having issues with spotting in between cycles and bleeding after intercourse

## 2021-12-28 LAB — PROLACTIN: Prolactin: 15.2 ng/mL (ref 4.8–23.3)

## 2021-12-28 LAB — TSH RFX ON ABNORMAL TO FREE T4: TSH: 0.179 u[IU]/mL — ABNORMAL LOW (ref 0.450–4.500)

## 2021-12-28 LAB — T4F: T4,Free (Direct): 1.47 ng/dL (ref 0.82–1.77)

## 2021-12-29 ENCOUNTER — Encounter: Payer: Self-pay | Admitting: Obstetrics and Gynecology

## 2021-12-29 DIAGNOSIS — E038 Other specified hypothyroidism: Secondary | ICD-10-CM | POA: Insufficient documentation

## 2021-12-29 LAB — CERVICOVAGINAL ANCILLARY ONLY
Bacterial Vaginitis (gardnerella): NEGATIVE
Candida Glabrata: NEGATIVE
Candida Vaginitis: NEGATIVE
Chlamydia: NEGATIVE
Comment: NEGATIVE
Comment: NEGATIVE
Comment: NEGATIVE
Comment: NEGATIVE
Comment: NEGATIVE
Comment: NORMAL
Neisseria Gonorrhea: NEGATIVE
Trichomonas: NEGATIVE

## 2022-01-02 LAB — CYTOLOGY - PAP
Comment: NEGATIVE
Diagnosis: HIGH — AB
High risk HPV: POSITIVE — AB

## 2022-01-03 ENCOUNTER — Telehealth: Payer: Self-pay | Admitting: *Deleted

## 2022-01-04 NOTE — Telephone Encounter (Signed)
Returned pts call regarding questions she had in regards to her results. Spoke at length about the pap result and needing colposcopy. Also tried to answer questions about the thyroid results and will have Dr Vergie Living call pt to talk more about these results per pt request.

## 2022-01-05 ENCOUNTER — Telehealth: Payer: Self-pay | Admitting: Obstetrics and Gynecology

## 2022-01-05 ENCOUNTER — Encounter: Payer: Self-pay | Admitting: Obstetrics and Gynecology

## 2022-01-05 NOTE — Telephone Encounter (Signed)
GYN Telephone Note  Patient called, regarding questions she has, at [phone] and generic VM picked up. I told her I would send her a mychart message  Cornelia Copa MD Attending [facility] (Faculty Practice) 01/05/2022 Time: [phone]

## 2022-01-10 ENCOUNTER — Inpatient Hospital Stay: Admission: RE | Admit: 2022-01-10 | Payer: Medicaid Other | Source: Ambulatory Visit

## 2022-01-24 ENCOUNTER — Telehealth: Payer: Self-pay

## 2022-01-24 NOTE — Telephone Encounter (Signed)
Telephoned patient at home number. Left a voice message with [facility] contact information. 

## 2022-01-30 ENCOUNTER — Ambulatory Visit: Admission: RE | Admit: 2022-01-30 | Payer: Medicaid Other | Source: Ambulatory Visit

## 2022-02-06 ENCOUNTER — Inpatient Hospital Stay: Admission: RE | Admit: 2022-02-06 | Payer: Medicaid Other | Source: Ambulatory Visit

## 2022-02-06 IMAGING — CR DG CHEST 2V
2 series · 2 of 2 positions shown · non-contrast
Comparison: Radiograph 03/28/2018

CLINICAL DATA: Cough, shortness of breath and hypoxia. Fever and
chills.

EXAM:
CHEST - 2 VIEW

[chest pa]
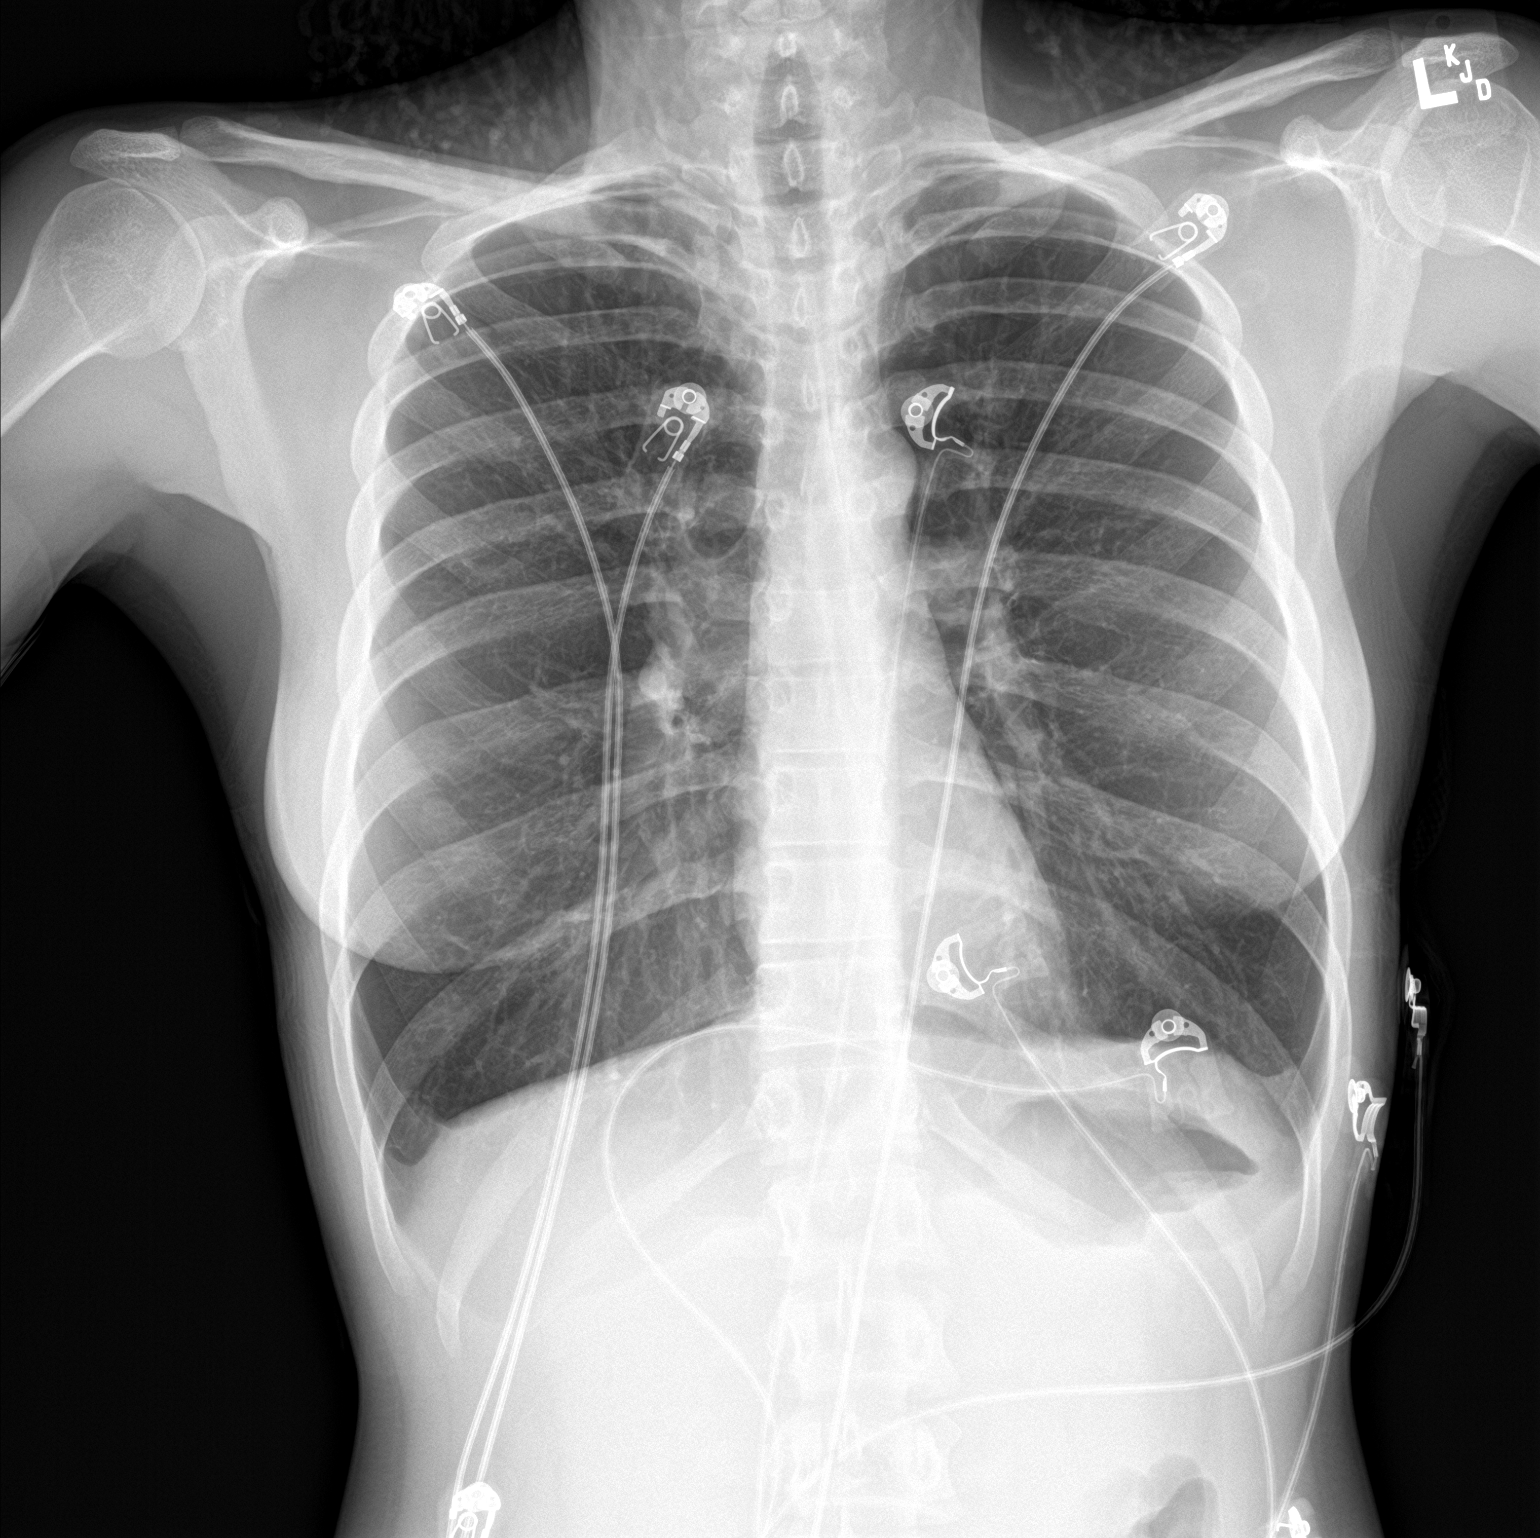

[chest lat]
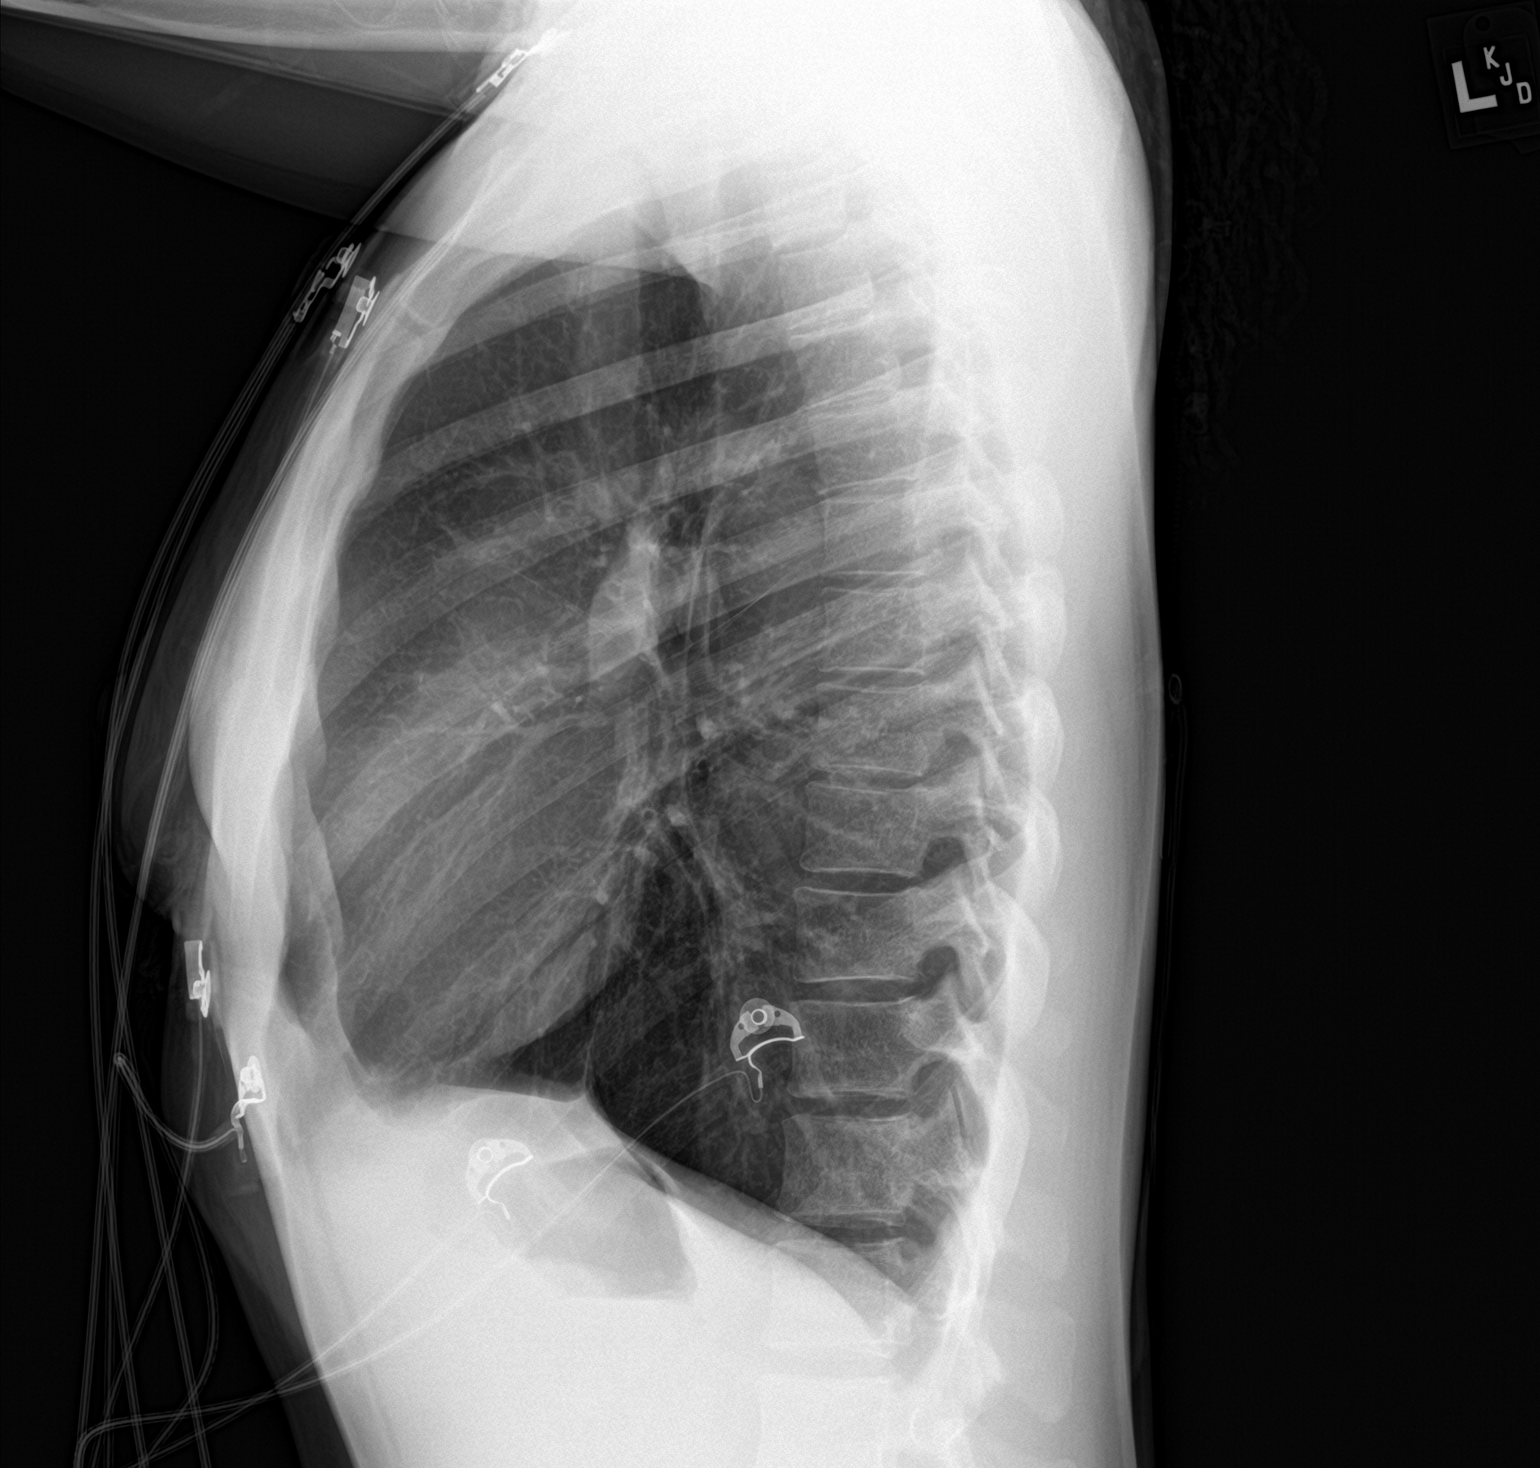

[2 of 2 positions shown; findings below may reference images not displayed]

FINDINGS: Chronic hyperinflation and blunting of the costophrenic angles. No
focal airspace disease. Heart is normal in size with normal
mediastinal contours. No pneumothorax or pneumomediastinum. No
pulmonary edema. No acute osseous abnormalities.
IMPRESSION: Chronic hyperinflation without acute chest finding.

## 2022-02-10 IMAGING — CT CT ABD-PELV W/ CM
2 of 4 series · 16 of 46 positions shown, 18 images · IV contrast (Omni 300)
Comparison: None.

CLINICAL DATA: Right lower quadrant abdominal pain.

EXAM:
CT ABDOMEN AND PELVIS WITH CONTRAST
TECHNIQUE: Multidetector CT imaging of the abdomen and pelvis was performed
using the standard protocol following bolus administration of
intravenous contrast.
CONTRAST:  80mL OMNIPAQUE IOHEXOL 300 MG/ML  SOLN

[Series 3: a/p w/ 5mm · axial · 0.70mm/px · z∈[-730,-375]mm · 13 of 77 slices shown, 15 images]
[im 3/77  soft-tissue]
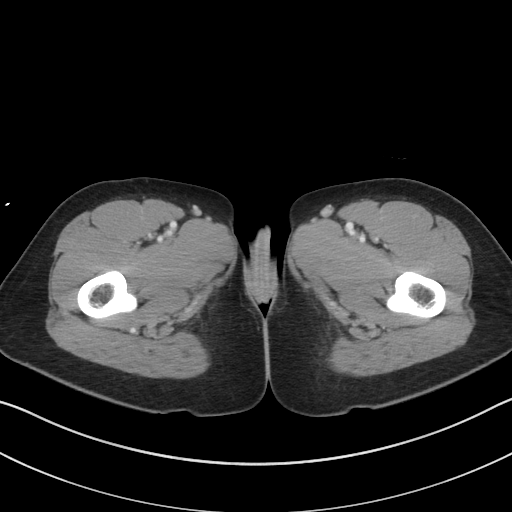
[im 3/77  bone]
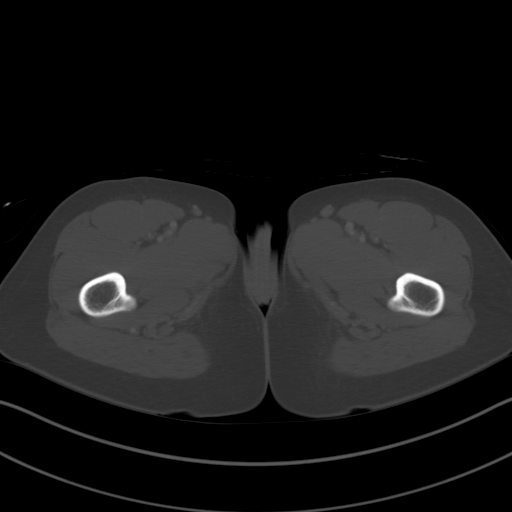
[im 9/77  soft-tissue]
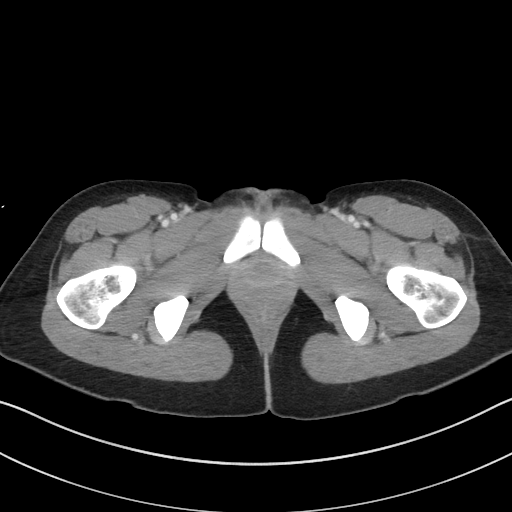
[im 15/77  soft-tissue]
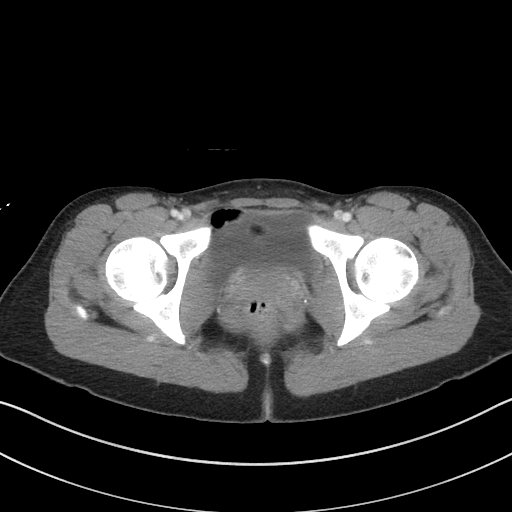
[im 21/77  soft-tissue]
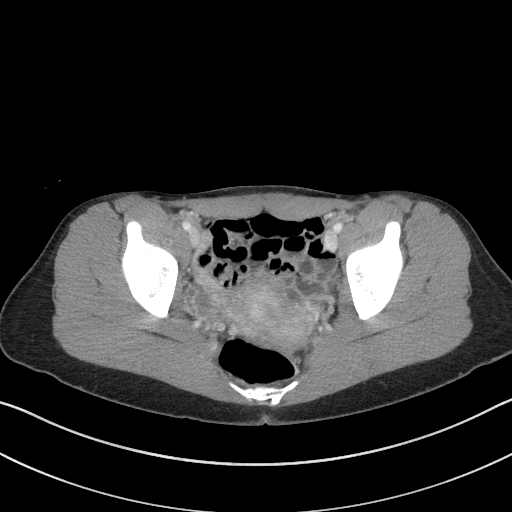
[im 27/77  soft-tissue]
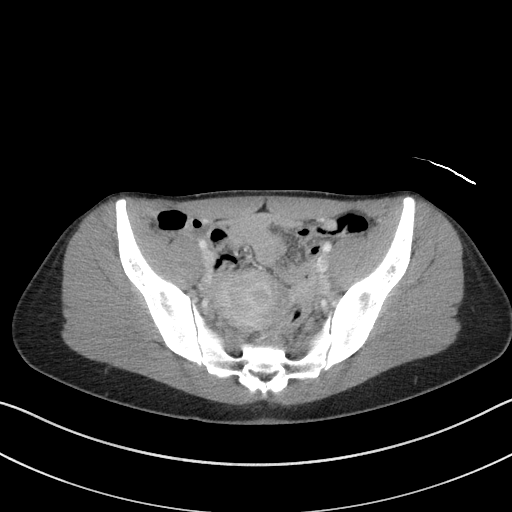
[im 33/77  soft-tissue]
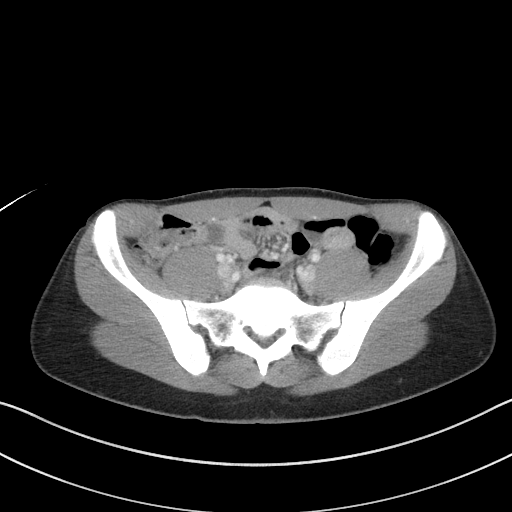
[im 39/77  soft-tissue]
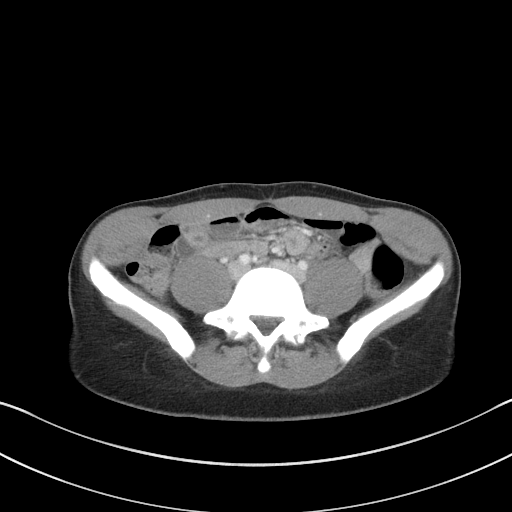
[im 44/77  soft-tissue]
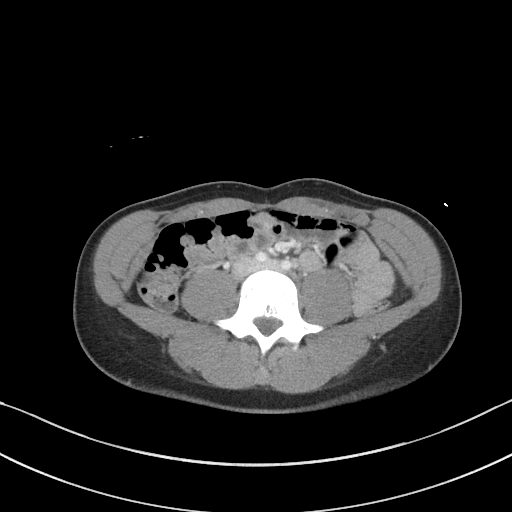
[im 50/77  soft-tissue]
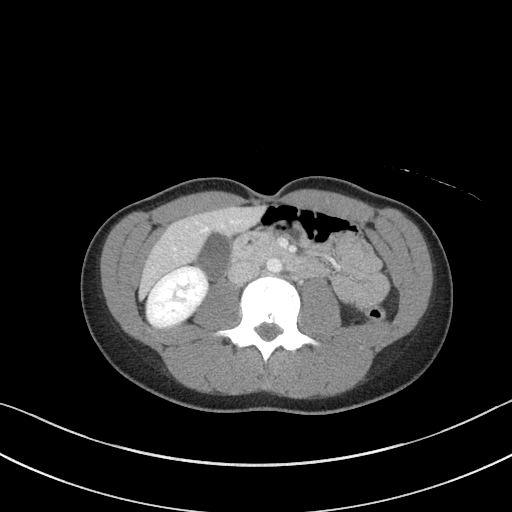
[im 50/77  bone]
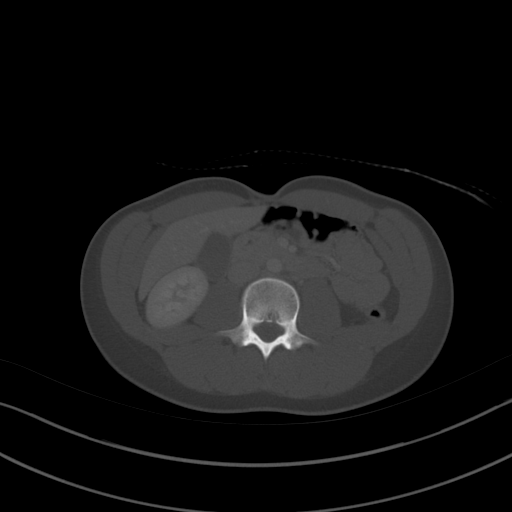
[im 56/77  soft-tissue]
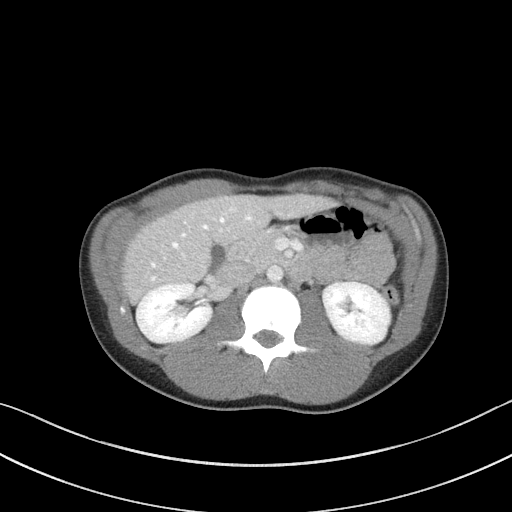
[im 62/77  soft-tissue]
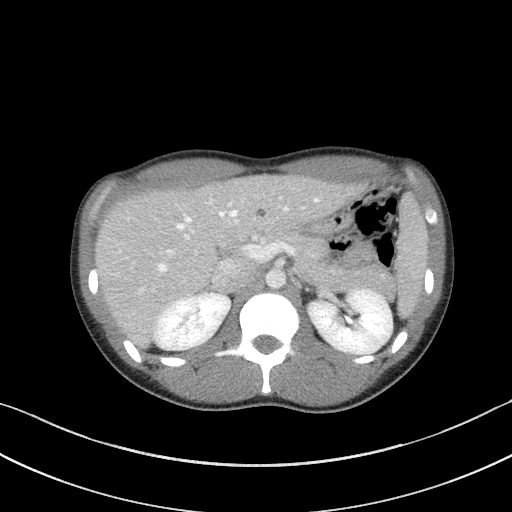
[im 68/77  soft-tissue]
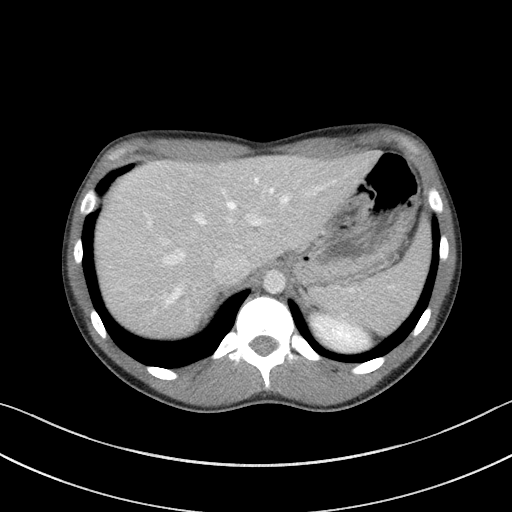
[im 74/77  soft-tissue]
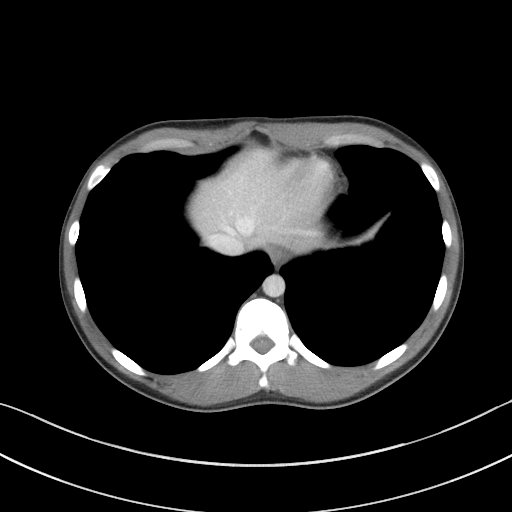

[Series 6: a/p w/ cor · coronal · 0.74mm/px · 3 of 108 slices shown]
[im 36/108  soft-tissue]
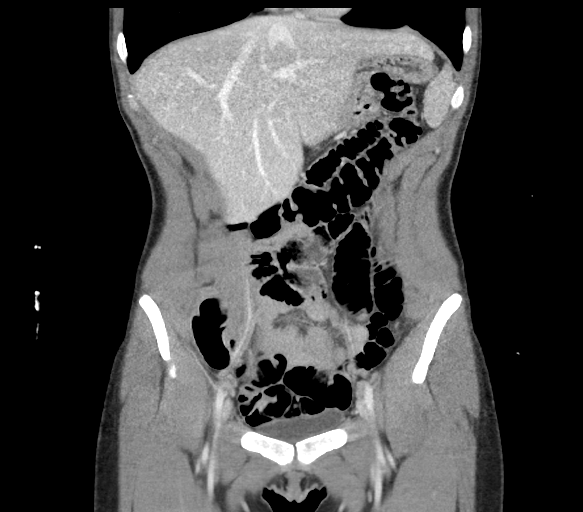
[im 48/108  soft-tissue]
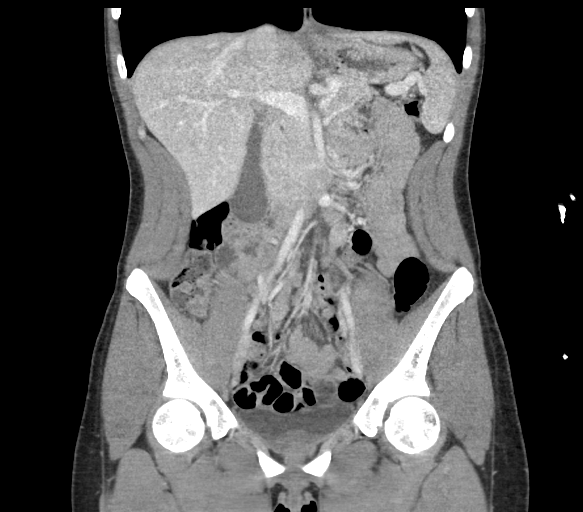
[im 60/108  soft-tissue]
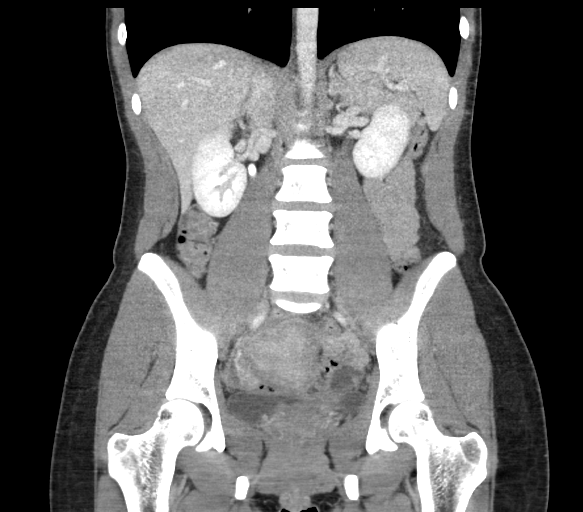

[16 of 46 positions shown; findings below may reference images not displayed]

FINDINGS: Lower chest: Lung bases are clear.

Hepatobiliary: Subcentimeter low-density focus in the left hepatic
lobe is too small to characterize but likely small cyst. No
suspicious liver lesion. Gallbladder physiologically distended, no
calcified stone. No biliary dilatation.

Pancreas: No ductal dilatation or inflammation.

Spleen: Normal in size without focal abnormality.

Adrenals/Urinary Tract: Adrenal glands are unremarkable. Kidneys are
normal, without focal lesion or hydronephrosis. Excreted IV contrast
in the renal collecting systems limits assessment for renal calculi.
Bladder is unremarkable.

Stomach/Bowel: Bowel assessment is limited in the absence of enteric
contrast and paucity of intra-abdominal fat. The stomach is
nondistended. There is no small bowel obstruction. No small bowel
inflammatory change. Normal appendix identified with moderate
certainty, series 6, image 48. There is no evidence of appendicitis.
Majority of the colon is nondistended which limits assessment. No
colonic inflammatory change.

Vascular/Lymphatic: No acute vascular findings. Normal caliber
abdominal aorta. Patent portal vein. No portal venous or mesenteric
gas. There is no bulky abdominopelvic adenopathy.

Reproductive: Retroverted uterus.  No adnexal mass.

Other: No free air or free fluid. No focal fluid collection. No
abdominal wall hernia.

Musculoskeletal: There are no acute or suspicious osseous
abnormalities.
IMPRESSION: No acute abnormality or explanation for abdominal pain. Normal
appendix.

## 2022-02-14 ENCOUNTER — Encounter: Payer: Medicaid Other | Admitting: Obstetrics and Gynecology

## 2022-02-25 ENCOUNTER — Ambulatory Visit: Payer: Self-pay

## 2022-03-16 ENCOUNTER — Encounter: Payer: Self-pay | Admitting: Obstetrics and Gynecology

## 2022-03-16 ENCOUNTER — Encounter: Payer: Medicaid Other | Admitting: Obstetrics and Gynecology

## 2022-03-20 NOTE — Progress Notes (Signed)
Patient did not keep her colposcopy appointment for 03/16/2022.  Office to contact to reschedule  Cornelia Copa MD Attending Center for [facility])

## 2022-04-03 ENCOUNTER — Ambulatory Visit (INDEPENDENT_AMBULATORY_CARE_PROVIDER_SITE_OTHER): Payer: Self-pay | Admitting: Obstetrics & Gynecology

## 2022-04-03 ENCOUNTER — Encounter: Payer: Self-pay | Admitting: Obstetrics & Gynecology

## 2022-04-03 ENCOUNTER — Other Ambulatory Visit (HOSPITAL_COMMUNITY)
Admission: RE | Admit: 2022-04-03 | Discharge: 2022-04-03 | Disposition: A | Discharge status: Home / Self Care | Payer: Medicaid Other | Source: Ambulatory Visit | Attending: Obstetrics & Gynecology | Admitting: Obstetrics & Gynecology

## 2022-04-03 VITALS — BP 124/76 | HR 86

## 2022-04-03 DIAGNOSIS — R87613 High grade squamous intraepithelial lesion on cytologic smear of cervix (HGSIL): Secondary | ICD-10-CM | POA: Diagnosis present

## 2022-04-03 DIAGNOSIS — Z23 Encounter for immunization: Secondary | ICD-10-CM

## 2022-04-03 DIAGNOSIS — Z113 Encounter for screening for infections with a predominantly sexual mode of transmission: Secondary | ICD-10-CM

## 2022-04-03 DIAGNOSIS — R87810 Cervical high risk human papillomavirus (HPV) DNA test positive: Secondary | ICD-10-CM

## 2022-04-03 DIAGNOSIS — B9689 Other specified bacterial agents as the cause of diseases classified elsewhere: Secondary | ICD-10-CM

## 2022-04-03 DIAGNOSIS — N760 Acute vaginitis: Secondary | ICD-10-CM

## 2022-04-03 NOTE — Patient Instructions (Signed)
COLPOSCOPY POST-PROCEDURE INSTRUCTIONS  You may take Ibuprofen, Aleve or Tylenol for cramping if needed.  If Monsel's solution was used, you will have a black discharge.  Light bleeding is normal.  If bleeding is heavier than your period, please call.  Put nothing in your vagina until the bleeding or discharge stops (usually 3 days).  We will call you within one week with biopsy results or discuss the results at your follow-up appointment if needed.  

## 2022-04-03 NOTE — Progress Notes (Signed)
    GYNECOLOGY OFFICE COLPOSCOPY PROCEDURE NOTE  31 y.o. G0P0000 here for colposcopy for high-grade squamous intraepithelial neoplasia  (HGSIL-encompassing moderate and severe dysplasia) pap smear on 12/27/2021. Discussed role for HPV in cervical dysplasia, need for surveillance.  Also counseled about HPV vaccine, patient will start series today. Patient reports having some concerns about vaginitis, wants full vaginitis and STI panel. Also wants Mycoplasma test today.  Chaperone was present during entire procedure.  Patient gave informed written consent, time out was performed.  Placed in lithotomy position.  Pink vaginal discharge noted, testing sample obtained.  Cervix viewed with speculum and colposcope after application of acetic acid.   Colposcopy adequate? Yes Acetowhite lesions noted at 10 and 12 o'clock and some mosaicism noted around 12 o'clock; corresponding biopsies obtained.  ECC specimen obtained.  All specimens were labeled and sent to pathology. Monsel's solution placed to help with hemostasis.  Patient was given post procedure instructions.  Will follow up vaginitis and STI screen, pathology and manage accordingly; patient will be contacted with results and recommendations.   Patient is also to get pelvic ultrasound re-scheduled, this was ordered by Dr. Vergie Living given her reported occasional pelvic pain and menstrual irregularities.  Will follow up as needed, she is unwilling to do hormonal therapy at this point.    Patient received HPV vaccine #1 today. Understands it is important to complete vaccine series. Routine preventative health maintenance measures emphasized.    Jaynie Collins, MD, FACOG Obstetrician & Gynecologist, [facility] for [employer], [facility]

## 2022-04-04 LAB — RPR+HBSAG+HCVAB+...
HIV Screen 4th Generation wRfx: NONREACTIVE
Hep C Virus Ab: NONREACTIVE
Hepatitis B Surface Ag: NEGATIVE
RPR Ser Ql: NONREACTIVE

## 2022-04-06 ENCOUNTER — Encounter: Payer: Self-pay | Admitting: Obstetrics & Gynecology

## 2022-04-06 DIAGNOSIS — D069 Carcinoma in situ of cervix, unspecified: Secondary | ICD-10-CM | POA: Insufficient documentation

## 2022-04-06 LAB — NUSWAB VG+, CANDIDA 6SP
Atopobium vaginae: HIGH Score — AB
C PARAPSILOSIS/TROPICALIS: NEGATIVE
Candida albicans, NAA: NEGATIVE
Candida glabrata, NAA: NEGATIVE
Candida krusei, NAA: NEGATIVE
Candida lusitaniae, NAA: NEGATIVE
Chlamydia trachomatis, NAA: NEGATIVE
Megasphaera 1: HIGH Score — AB
Neisseria gonorrhoeae, NAA: NEGATIVE
Trich vag by NAA: NEGATIVE

## 2022-04-06 LAB — SURGICAL PATHOLOGY

## 2022-04-06 MED ORDER — CLINDAMYCIN HCL 300 MG PO CAPS: 300.0000 mg | ORAL_CAPSULE | Freq: Two times a day (BID) | ORAL | 0 refills | Status: DC

## 2022-04-06 MED ORDER — CLINDAMYCIN HCL 300 MG PO CAPS: 300.0000 mg | ORAL_CAPSULE | Freq: Two times a day (BID) | ORAL | 0 refills | Status: AC

## 2022-04-06 NOTE — Addendum Note (Signed)
Addended by: Jaynie Collins A on: 04/06/2022 07:40 PM   Modules accepted: Orders

## 2022-04-06 NOTE — Progress Notes (Signed)
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 LEEP is recommended. Please call to inform patient of results and recommendations and schedule appointment accordingly.  Jaynie Collins, MD

## 2022-04-06 NOTE — Addendum Note (Signed)
Addended by: Jaynie Collins A on: 04/06/2022 06:56 PM   Modules accepted: Orders

## 2022-04-10 LAB — M GENITALIUM NAA, SWAB: Mycoplasma genitalium NAA: NEGATIVE

## 2022-04-11 ENCOUNTER — Inpatient Hospital Stay: Admission: RE | Admit: 2022-04-11 | Payer: Medicaid Other | Source: Ambulatory Visit

## 2022-04-13 ENCOUNTER — Telehealth: Payer: Self-pay

## 2022-04-13 NOTE — Telephone Encounter (Addendum)
Patient returned call, stated that she received a message stating she has full medicaid, and is waiting to receive her welcome package. Patient to call back if finds out that she does not have full medicaid coverage.   Attempted to contact patient regarding completing [employer] Medicaid application. Left message on voicemail requesting a return call.

## 2022-04-19 ENCOUNTER — Telehealth: Payer: Medicaid Other

## 2022-04-28 DIAGNOSIS — F411 Generalized anxiety disorder: Secondary | ICD-10-CM | POA: Diagnosis not present

## 2022-04-28 DIAGNOSIS — F331 Major depressive disorder, recurrent, moderate: Secondary | ICD-10-CM | POA: Diagnosis not present

## 2022-05-04 DIAGNOSIS — Z130 Encounter for screening for diseases of the blood and blood-forming organs and certain disorders involving the immune mechanism: Secondary | ICD-10-CM | POA: Diagnosis not present

## 2022-05-04 DIAGNOSIS — J4520 Mild intermittent asthma, uncomplicated: Secondary | ICD-10-CM | POA: Diagnosis not present

## 2022-05-04 DIAGNOSIS — Z13220 Encounter for screening for lipoid disorders: Secondary | ICD-10-CM | POA: Diagnosis not present

## 2022-05-04 DIAGNOSIS — F32A Depression, unspecified: Secondary | ICD-10-CM | POA: Diagnosis not present

## 2022-05-04 DIAGNOSIS — Z1329 Encounter for screening for other suspected endocrine disorder: Secondary | ICD-10-CM | POA: Diagnosis not present

## 2022-05-04 DIAGNOSIS — Z131 Encounter for screening for diabetes mellitus: Secondary | ICD-10-CM | POA: Diagnosis not present

## 2022-05-04 DIAGNOSIS — Z0000 Encounter for general adult medical examination without abnormal findings: Secondary | ICD-10-CM | POA: Diagnosis not present

## 2022-05-04 DIAGNOSIS — R5383 Other fatigue: Secondary | ICD-10-CM | POA: Diagnosis not present

## 2022-05-04 DIAGNOSIS — F419 Anxiety disorder, unspecified: Secondary | ICD-10-CM | POA: Diagnosis not present

## 2022-05-04 DIAGNOSIS — D069 Carcinoma in situ of cervix, unspecified: Secondary | ICD-10-CM | POA: Diagnosis not present

## 2022-05-24 ENCOUNTER — Telehealth: Payer: Self-pay | Admitting: *Deleted

## 2022-05-24 NOTE — Telephone Encounter (Signed)
-----  Message from Seward Grater sent at 05/24/2022  9:11 AM EST ----- Regarding: Procedure Question Pt called in stating she would like more information regarding her Leep procedure. She is worried about pain, the process, and anything else she needs to know to prep for procedure.  Expained to her it is similar to the Pap and Colpo, but she would like further explanation.   She would like to be reached on her cell [phone]

## 2022-05-24 NOTE — Telephone Encounter (Signed)
Left message for pt to call back

## 2022-05-30 ENCOUNTER — Encounter: Payer: Self-pay | Admitting: Obstetrics & Gynecology

## 2022-05-30 ENCOUNTER — Other Ambulatory Visit (HOSPITAL_COMMUNITY)
Admission: RE | Admit: 2022-05-30 | Discharge: 2022-05-30 | Disposition: A | Discharge status: Home / Self Care | Payer: Medicaid Other | Source: Ambulatory Visit | Attending: Obstetrics & Gynecology | Admitting: Obstetrics & Gynecology

## 2022-05-30 ENCOUNTER — Ambulatory Visit (INDEPENDENT_AMBULATORY_CARE_PROVIDER_SITE_OTHER): Payer: Medicaid Other | Admitting: Obstetrics & Gynecology

## 2022-05-30 VITALS — BP 125/77 | HR 69

## 2022-05-30 DIAGNOSIS — Z23 Encounter for immunization: Secondary | ICD-10-CM

## 2022-05-30 DIAGNOSIS — Z01812 Encounter for preprocedural laboratory examination: Secondary | ICD-10-CM

## 2022-05-30 DIAGNOSIS — D069 Carcinoma in situ of cervix, unspecified: Secondary | ICD-10-CM | POA: Insufficient documentation

## 2022-05-30 DIAGNOSIS — N871 Moderate cervical dysplasia: Secondary | ICD-10-CM | POA: Diagnosis not present

## 2022-05-30 LAB — POCT URINE PREGNANCY: Preg Test, Ur: NEGATIVE

## 2022-05-30 NOTE — Progress Notes (Signed)
   GYNECOLOGY OFFICE PROCEDURE NOTE  Nitara Klaas is a 32 y.o. G0P0000 here for LEEP. No GYN concerns. Pap smear and colposcopy history reviewed.    Pap 12/27/2021 HGSIL Colpo Biopsy 04/03/22 CIN 3 extending to endocervical glands but ECC was benign  Risks, benefits, alternatives, and limitations of procedure explained to patient, including pain, bleeding, infection, failure to remove abnormal tissue and failure to cure dysplasia, need for repeat procedures, damage to pelvic organs, cervical incompetence.  Role of HPV,cervical dysplasia and need for close followup was empasized. Informed written consent was obtained. All questions were answered. Time out performed. Urine pregnancy test was negative.  ??Procedure: The patient was placed in lithotomy position and the bivalved coated speculum was placed in the patient's vagina. A grounding pad placed on the patient. Colposcopy was done with acetic acid, and areas of acetowhite changes were noted around the transformation zone especially around 10-12 o'clock of the cervix.   Local anesthesia was administered via an intracervical block using 10 ml of 2% Lidocaine with epinephrine. The suction was turned on and the Medium-Extended Fisher Cone Biopsy Excisor on 10 Watts of blended current was used to excise the area of decreased uptake and excise the entire transformation zone. Excellent hemostasis was achieved using roller ball coagulation set at 60 Watts coagulation current. Monsel's solution was then applied and the speculum was removed from the vagina. Specimens were sent to pathology.  ?The patient tolerated the procedure well. Post-operative instructions given to patient, including instruction to seek medical attention for persistent bright red bleeding, fever, abdominal/pelvic pain, dysuria, nausea or vomiting. She was also told about the possibility of having copious yellow to black tinged discharge for weeks. She was counseled to avoid anything in the  vagina (sex/douching/tampons) for 3 weeks. She has a 4 week post-operative check to assess wound healing, review results and discuss further management.   Of note, patient received Gardasil #2 today. She will return in 4 months for third dose and likely repeat pap smear.    Verita Schneiders, MD, [place] for [employer], [place]

## 2022-06-01 LAB — SURGICAL PATHOLOGY

## 2022-06-30 ENCOUNTER — Telehealth: Payer: Self-pay | Admitting: *Deleted

## 2022-06-30 NOTE — Telephone Encounter (Signed)
-----   Message from Seward Grater sent at 06/29/2022 11:14 AM EST ----- Regarding: "some shot" Pt called in stating she wants to speak to a nurse about her shot, she is unsure of the shot she is asking about.   Pt would like a phone call today.

## 2022-06-30 NOTE — Telephone Encounter (Signed)
Left message for pt to call me back or send me a MyChart message

## 2022-07-04 ENCOUNTER — Ambulatory Visit: Payer: Medicaid Other | Admitting: Obstetrics & Gynecology

## 2022-07-06 ENCOUNTER — Ambulatory Visit: Payer: Medicaid Other | Admitting: Obstetrics and Gynecology

## 2022-07-06 ENCOUNTER — Encounter: Payer: Self-pay | Admitting: Obstetrics and Gynecology

## 2022-07-06 ENCOUNTER — Other Ambulatory Visit (HOSPITAL_COMMUNITY)
Admission: RE | Admit: 2022-07-06 | Discharge: 2022-07-06 | Disposition: A | Discharge status: Home / Self Care | Payer: Medicaid Other | Source: Ambulatory Visit | Attending: Obstetrics and Gynecology | Admitting: Obstetrics and Gynecology

## 2022-07-06 VITALS — BP 137/86 | HR 79 | Wt 124.0 lb

## 2022-07-06 DIAGNOSIS — R87613 High grade squamous intraepithelial lesion on cytologic smear of cervix (HGSIL): Secondary | ICD-10-CM

## 2022-07-06 DIAGNOSIS — Z202 Contact with and (suspected) exposure to infections with a predominantly sexual mode of transmission: Secondary | ICD-10-CM

## 2022-07-06 DIAGNOSIS — Z9889 Other specified postprocedural states: Secondary | ICD-10-CM | POA: Diagnosis not present

## 2022-07-06 MED ORDER — METRONIDAZOLE 500 MG PO TABS: 500.0000 mg | ORAL_TABLET | Freq: Two times a day (BID) | ORAL | 0 refills | Status: AC

## 2022-07-06 NOTE — Progress Notes (Signed)
RGYN here to F/U from LEEP.  LMP:07/06/22  CC: possible BV wants STD testing and Full Panel STD Screening.

## 2022-07-06 NOTE — Progress Notes (Signed)
Obstetrics and Gynecology Visit Return Patient Evaluation  Appointment Date: 07/06/2022  Primary Care Provider: Kathyrn Lass  OBGYN Clinic: [facility] for [facility]  Chief Complaint: follow up LEEP  History of Present Illness:  Christy Little is a 32 y.o. s/p 1/23 LEEP with Dr. Harolyn Rutherford for CIN3 on biopsy and negative ECC. LEEP showed CIN 3 with EC gland involvement but negative margins. No post LEEP ECC was done.   Patient doing well except she thinks she may have BV. Period started today.   Review of Systems:  as noted in the History of Present Illness.  Patient Active Problem List   Diagnosis Date Noted   History of loop electrical excision procedure (LEEP) 07/06/2022   Cervical intraepithelial neoplasia grade III with severe dysplasia 04/06/2022   HGSIL on Pap smear of cervix on 12/27/2021 04/03/2022   Subclinical hypothyroidism 12/29/2021   Hypertension 07/03/2016   Anxiety and depression 07/03/2016   Medications:  Dorthula Matas had no medications administered during this visit. Current Outpatient Medications  Medication Sig Dispense Refill   albuterol (VENTOLIN HFA) 108 (90 Base) MCG/ACT inhaler Inhale 1-2 puffs into the lungs every 6 (six) hours as needed for wheezing or shortness of breath. 18 g 0   No current facility-administered medications for this visit.    Allergies: is allergic to amoxicillin and flagyl [metronidazole].  Physical Exam:  BP 137/86   Pulse 79   Wt 124 lb (56.2 kg)   LMP 07/06/2022 (Exact Date)   BMI 22.68 kg/m  Body mass index is 22.68 kg/m. General appearance: Well nourished, well developed female in no acute distress.  Abdomen: diffusely non tender to palpation, non distended, and no masses, hernias Neuro/Psych:  Normal mood and affect.    Pelvic exam:  Cervical exam performed in the presence of a chaperone EGBUS: normal Vaginal vault: 7m old blood in vault, no active bleeding Cervix:  wnl, completely healed Bimanual:  deferred   Assessment: pt doing well  Plan:  1. STD exposure Desires screening - Cervicovaginal ancillary only( [place]) - RPR+HBsAg+HCVAb+...  2. History of loop electrical excision procedure (LEEP) Okay to come off pelvic rest  3. HGSIL on Pap smear of cervix on 12/27/2021 D/w her re: recommendation for 4-665mepeat pap smear   Return in about 3 months (around 10/04/2022) for 3 month pap smear with Dr. AnHarolyn Rutherford No future appointments.  ChDurene RomansD Attending Center for [employer]

## 2022-07-07 LAB — CERVICOVAGINAL ANCILLARY ONLY
Bacterial Vaginitis (gardnerella): NEGATIVE
Candida Glabrata: NEGATIVE
Candida Vaginitis: NEGATIVE
Chlamydia: NEGATIVE
Comment: NEGATIVE
Comment: NEGATIVE
Comment: NEGATIVE
Comment: NEGATIVE
Comment: NEGATIVE
Comment: NORMAL
Neisseria Gonorrhea: NEGATIVE
Trichomonas: NEGATIVE

## 2022-07-07 LAB — RPR+HBSAG+HCVAB+...
HIV Screen 4th Generation wRfx: NONREACTIVE
Hep C Virus Ab: NONREACTIVE
Hepatitis B Surface Ag: NEGATIVE
RPR Ser Ql: NONREACTIVE

## 2022-07-13 DIAGNOSIS — M4802 Spinal stenosis, cervical region: Secondary | ICD-10-CM | POA: Diagnosis not present

## 2022-07-13 DIAGNOSIS — M5412 Radiculopathy, cervical region: Secondary | ICD-10-CM | POA: Diagnosis not present

## 2022-07-13 DIAGNOSIS — M50322 Other cervical disc degeneration at C5-C6 level: Secondary | ICD-10-CM | POA: Diagnosis not present

## 2022-07-14 DIAGNOSIS — M9904 Segmental and somatic dysfunction of sacral region: Secondary | ICD-10-CM | POA: Diagnosis not present

## 2022-07-14 DIAGNOSIS — M5021 Other cervical disc displacement,  high cervical region: Secondary | ICD-10-CM | POA: Diagnosis not present

## 2022-07-14 DIAGNOSIS — M9902 Segmental and somatic dysfunction of thoracic region: Secondary | ICD-10-CM | POA: Diagnosis not present

## 2022-07-14 DIAGNOSIS — M9901 Segmental and somatic dysfunction of cervical region: Secondary | ICD-10-CM | POA: Diagnosis not present

## 2022-07-14 DIAGNOSIS — M5022 Other cervical disc displacement, mid-cervical region, unspecified level: Secondary | ICD-10-CM | POA: Diagnosis not present

## 2022-07-14 DIAGNOSIS — M9903 Segmental and somatic dysfunction of lumbar region: Secondary | ICD-10-CM | POA: Diagnosis not present

## 2022-07-14 DIAGNOSIS — M6089 Other myositis, multiple sites: Secondary | ICD-10-CM | POA: Diagnosis not present

## 2022-07-14 DIAGNOSIS — M4004 Postural kyphosis, thoracic region: Secondary | ICD-10-CM | POA: Diagnosis not present

## 2022-07-14 DIAGNOSIS — M5127 Other intervertebral disc displacement, lumbosacral region: Secondary | ICD-10-CM | POA: Diagnosis not present

## 2022-07-18 DIAGNOSIS — M9901 Segmental and somatic dysfunction of cervical region: Secondary | ICD-10-CM | POA: Diagnosis not present

## 2022-07-18 DIAGNOSIS — M5022 Other cervical disc displacement, mid-cervical region, unspecified level: Secondary | ICD-10-CM | POA: Diagnosis not present

## 2022-07-18 DIAGNOSIS — M9903 Segmental and somatic dysfunction of lumbar region: Secondary | ICD-10-CM | POA: Diagnosis not present

## 2022-07-18 DIAGNOSIS — M9902 Segmental and somatic dysfunction of thoracic region: Secondary | ICD-10-CM | POA: Diagnosis not present

## 2022-07-18 DIAGNOSIS — M5021 Other cervical disc displacement,  high cervical region: Secondary | ICD-10-CM | POA: Diagnosis not present

## 2022-07-18 DIAGNOSIS — M4004 Postural kyphosis, thoracic region: Secondary | ICD-10-CM | POA: Diagnosis not present

## 2022-07-18 DIAGNOSIS — M5127 Other intervertebral disc displacement, lumbosacral region: Secondary | ICD-10-CM | POA: Diagnosis not present

## 2022-07-18 DIAGNOSIS — M9904 Segmental and somatic dysfunction of sacral region: Secondary | ICD-10-CM | POA: Diagnosis not present

## 2022-07-18 DIAGNOSIS — M6089 Other myositis, multiple sites: Secondary | ICD-10-CM | POA: Diagnosis not present

## 2022-07-19 DIAGNOSIS — M9903 Segmental and somatic dysfunction of lumbar region: Secondary | ICD-10-CM | POA: Diagnosis not present

## 2022-07-19 DIAGNOSIS — M9901 Segmental and somatic dysfunction of cervical region: Secondary | ICD-10-CM | POA: Diagnosis not present

## 2022-07-19 DIAGNOSIS — M5127 Other intervertebral disc displacement, lumbosacral region: Secondary | ICD-10-CM | POA: Diagnosis not present

## 2022-07-19 DIAGNOSIS — M9902 Segmental and somatic dysfunction of thoracic region: Secondary | ICD-10-CM | POA: Diagnosis not present

## 2022-07-19 DIAGNOSIS — M5021 Other cervical disc displacement,  high cervical region: Secondary | ICD-10-CM | POA: Diagnosis not present

## 2022-07-19 DIAGNOSIS — M6089 Other myositis, multiple sites: Secondary | ICD-10-CM | POA: Diagnosis not present

## 2022-07-19 DIAGNOSIS — M9904 Segmental and somatic dysfunction of sacral region: Secondary | ICD-10-CM | POA: Diagnosis not present

## 2022-07-19 DIAGNOSIS — M5022 Other cervical disc displacement, mid-cervical region, unspecified level: Secondary | ICD-10-CM | POA: Diagnosis not present

## 2022-07-19 DIAGNOSIS — M4004 Postural kyphosis, thoracic region: Secondary | ICD-10-CM | POA: Diagnosis not present

## 2022-07-25 DIAGNOSIS — M9901 Segmental and somatic dysfunction of cervical region: Secondary | ICD-10-CM | POA: Diagnosis not present

## 2022-07-25 DIAGNOSIS — M5127 Other intervertebral disc displacement, lumbosacral region: Secondary | ICD-10-CM | POA: Diagnosis not present

## 2022-07-25 DIAGNOSIS — M6089 Other myositis, multiple sites: Secondary | ICD-10-CM | POA: Diagnosis not present

## 2022-07-25 DIAGNOSIS — M5022 Other cervical disc displacement, mid-cervical region, unspecified level: Secondary | ICD-10-CM | POA: Diagnosis not present

## 2022-07-25 DIAGNOSIS — M4004 Postural kyphosis, thoracic region: Secondary | ICD-10-CM | POA: Diagnosis not present

## 2022-07-25 DIAGNOSIS — M9903 Segmental and somatic dysfunction of lumbar region: Secondary | ICD-10-CM | POA: Diagnosis not present

## 2022-07-25 DIAGNOSIS — M5021 Other cervical disc displacement,  high cervical region: Secondary | ICD-10-CM | POA: Diagnosis not present

## 2022-07-25 DIAGNOSIS — M9904 Segmental and somatic dysfunction of sacral region: Secondary | ICD-10-CM | POA: Diagnosis not present

## 2022-07-25 DIAGNOSIS — M9902 Segmental and somatic dysfunction of thoracic region: Secondary | ICD-10-CM | POA: Diagnosis not present

## 2022-08-07 DIAGNOSIS — M5412 Radiculopathy, cervical region: Secondary | ICD-10-CM | POA: Diagnosis not present

## 2022-08-07 DIAGNOSIS — J4520 Mild intermittent asthma, uncomplicated: Secondary | ICD-10-CM | POA: Diagnosis not present

## 2022-08-07 DIAGNOSIS — M5416 Radiculopathy, lumbar region: Secondary | ICD-10-CM | POA: Diagnosis not present

## 2022-08-17 DIAGNOSIS — R102 Pelvic and perineal pain: Secondary | ICD-10-CM | POA: Diagnosis not present

## 2022-08-17 DIAGNOSIS — D069 Carcinoma in situ of cervix, unspecified: Secondary | ICD-10-CM | POA: Diagnosis not present

## 2022-08-17 DIAGNOSIS — R109 Unspecified abdominal pain: Secondary | ICD-10-CM | POA: Diagnosis not present

## 2022-08-18 DIAGNOSIS — R109 Unspecified abdominal pain: Secondary | ICD-10-CM | POA: Diagnosis not present

## 2022-08-18 DIAGNOSIS — R102 Pelvic and perineal pain: Secondary | ICD-10-CM | POA: Diagnosis not present

## 2022-08-24 DIAGNOSIS — R102 Pelvic and perineal pain: Secondary | ICD-10-CM | POA: Diagnosis not present

## 2022-08-24 DIAGNOSIS — D251 Intramural leiomyoma of uterus: Secondary | ICD-10-CM | POA: Diagnosis not present

## 2022-10-16 DIAGNOSIS — M50122 Cervical disc disorder at C5-C6 level with radiculopathy: Secondary | ICD-10-CM | POA: Diagnosis not present

## 2022-10-16 DIAGNOSIS — M5412 Radiculopathy, cervical region: Secondary | ICD-10-CM | POA: Diagnosis not present

## 2022-11-16 DIAGNOSIS — Z113 Encounter for screening for infections with a predominantly sexual mode of transmission: Secondary | ICD-10-CM | POA: Diagnosis not present

## 2022-11-16 DIAGNOSIS — J4520 Mild intermittent asthma, uncomplicated: Secondary | ICD-10-CM | POA: Diagnosis not present

## 2022-11-16 DIAGNOSIS — F331 Major depressive disorder, recurrent, moderate: Secondary | ICD-10-CM | POA: Diagnosis not present

## 2022-11-16 DIAGNOSIS — F411 Generalized anxiety disorder: Secondary | ICD-10-CM | POA: Diagnosis not present

## 2022-11-16 DIAGNOSIS — W57XXXS Bitten or stung by nonvenomous insect and other nonvenomous arthropods, sequela: Secondary | ICD-10-CM | POA: Diagnosis not present

## 2022-11-23 DIAGNOSIS — F411 Generalized anxiety disorder: Secondary | ICD-10-CM | POA: Diagnosis not present

## 2022-12-25 DIAGNOSIS — Z5941 Food insecurity: Secondary | ICD-10-CM | POA: Diagnosis not present

## 2022-12-25 DIAGNOSIS — F411 Generalized anxiety disorder: Secondary | ICD-10-CM | POA: Diagnosis not present

## 2022-12-25 DIAGNOSIS — R300 Dysuria: Secondary | ICD-10-CM | POA: Diagnosis not present

## 2022-12-25 DIAGNOSIS — D259 Leiomyoma of uterus, unspecified: Secondary | ICD-10-CM | POA: Diagnosis not present

## 2022-12-25 DIAGNOSIS — R1030 Lower abdominal pain, unspecified: Secondary | ICD-10-CM | POA: Diagnosis not present

## 2022-12-25 DIAGNOSIS — F331 Major depressive disorder, recurrent, moderate: Secondary | ICD-10-CM | POA: Diagnosis not present

## 2022-12-25 DIAGNOSIS — K581 Irritable bowel syndrome with constipation: Secondary | ICD-10-CM | POA: Diagnosis not present

## 2022-12-25 DIAGNOSIS — J4520 Mild intermittent asthma, uncomplicated: Secondary | ICD-10-CM | POA: Diagnosis not present

## 2023-02-21 DIAGNOSIS — Z7185 Encounter for immunization safety counseling: Secondary | ICD-10-CM | POA: Diagnosis not present

## 2023-02-21 DIAGNOSIS — Z5941 Food insecurity: Secondary | ICD-10-CM | POA: Diagnosis not present

## 2023-02-21 DIAGNOSIS — Z111 Encounter for screening for respiratory tuberculosis: Secondary | ICD-10-CM | POA: Diagnosis not present

## 2023-03-08 ENCOUNTER — Ambulatory Visit: Payer: Medicaid Other | Admitting: Obstetrics and Gynecology

## 2023-03-14 DIAGNOSIS — D069 Carcinoma in situ of cervix, unspecified: Secondary | ICD-10-CM | POA: Diagnosis not present

## 2023-03-14 DIAGNOSIS — D219 Benign neoplasm of connective and other soft tissue, unspecified: Secondary | ICD-10-CM | POA: Diagnosis not present

## 2023-03-14 DIAGNOSIS — N939 Abnormal uterine and vaginal bleeding, unspecified: Secondary | ICD-10-CM | POA: Diagnosis not present

## 2023-03-22 DIAGNOSIS — Z0000 Encounter for general adult medical examination without abnormal findings: Secondary | ICD-10-CM | POA: Diagnosis not present

## 2023-03-22 DIAGNOSIS — Z124 Encounter for screening for malignant neoplasm of cervix: Secondary | ICD-10-CM | POA: Diagnosis not present

## 2023-03-22 DIAGNOSIS — Z113 Encounter for screening for infections with a predominantly sexual mode of transmission: Secondary | ICD-10-CM | POA: Diagnosis not present

## 2023-03-22 DIAGNOSIS — Z23 Encounter for immunization: Secondary | ICD-10-CM | POA: Diagnosis not present

## 2023-04-26 DIAGNOSIS — N939 Abnormal uterine and vaginal bleeding, unspecified: Secondary | ICD-10-CM | POA: Diagnosis not present

## 2023-04-26 DIAGNOSIS — K581 Irritable bowel syndrome with constipation: Secondary | ICD-10-CM | POA: Diagnosis not present

## 2023-04-26 DIAGNOSIS — D259 Leiomyoma of uterus, unspecified: Secondary | ICD-10-CM | POA: Diagnosis not present

## 2023-05-15 DIAGNOSIS — M6281 Muscle weakness (generalized): Secondary | ICD-10-CM | POA: Diagnosis not present

## 2023-05-15 DIAGNOSIS — M5412 Radiculopathy, cervical region: Secondary | ICD-10-CM | POA: Diagnosis not present

## 2023-05-18 DIAGNOSIS — R1013 Epigastric pain: Secondary | ICD-10-CM | POA: Diagnosis not present

## 2023-05-18 DIAGNOSIS — K581 Irritable bowel syndrome with constipation: Secondary | ICD-10-CM | POA: Diagnosis not present

## 2023-06-05 DIAGNOSIS — M5412 Radiculopathy, cervical region: Secondary | ICD-10-CM | POA: Diagnosis not present

## 2023-06-05 DIAGNOSIS — M6281 Muscle weakness (generalized): Secondary | ICD-10-CM | POA: Diagnosis not present

## 2023-06-11 DIAGNOSIS — F411 Generalized anxiety disorder: Secondary | ICD-10-CM | POA: Diagnosis not present

## 2023-06-12 DIAGNOSIS — Z9229 Personal history of other drug therapy: Secondary | ICD-10-CM | POA: Diagnosis not present

## 2023-07-24 DIAGNOSIS — R1011 Right upper quadrant pain: Secondary | ICD-10-CM | POA: Diagnosis not present

## 2023-07-24 DIAGNOSIS — G8929 Other chronic pain: Secondary | ICD-10-CM | POA: Diagnosis not present

## 2023-07-24 DIAGNOSIS — R1030 Lower abdominal pain, unspecified: Secondary | ICD-10-CM | POA: Diagnosis not present

## 2023-07-24 DIAGNOSIS — R350 Frequency of micturition: Secondary | ICD-10-CM | POA: Diagnosis not present

## 2023-08-16 DIAGNOSIS — Z113 Encounter for screening for infections with a predominantly sexual mode of transmission: Secondary | ICD-10-CM | POA: Diagnosis not present

## 2023-08-16 DIAGNOSIS — K5904 Chronic idiopathic constipation: Secondary | ICD-10-CM | POA: Diagnosis not present

## 2023-08-16 DIAGNOSIS — N6012 Diffuse cystic mastopathy of left breast: Secondary | ICD-10-CM | POA: Diagnosis not present

## 2023-08-16 DIAGNOSIS — N6011 Diffuse cystic mastopathy of right breast: Secondary | ICD-10-CM | POA: Diagnosis not present

## 2023-08-16 DIAGNOSIS — N644 Mastodynia: Secondary | ICD-10-CM | POA: Diagnosis not present

## 2023-08-16 DIAGNOSIS — F411 Generalized anxiety disorder: Secondary | ICD-10-CM | POA: Diagnosis not present

## 2023-11-20 DIAGNOSIS — Z113 Encounter for screening for infections with a predominantly sexual mode of transmission: Secondary | ICD-10-CM | POA: Diagnosis not present

## 2023-11-20 DIAGNOSIS — N939 Abnormal uterine and vaginal bleeding, unspecified: Secondary | ICD-10-CM | POA: Diagnosis not present

## 2023-12-31 DIAGNOSIS — Z23 Encounter for immunization: Secondary | ICD-10-CM | POA: Diagnosis not present

## 2023-12-31 DIAGNOSIS — Z13220 Encounter for screening for lipoid disorders: Secondary | ICD-10-CM | POA: Diagnosis not present

## 2023-12-31 DIAGNOSIS — R632 Polyphagia: Secondary | ICD-10-CM | POA: Diagnosis not present

## 2023-12-31 DIAGNOSIS — R202 Paresthesia of skin: Secondary | ICD-10-CM | POA: Diagnosis not present

## 2023-12-31 DIAGNOSIS — Z0000 Encounter for general adult medical examination without abnormal findings: Secondary | ICD-10-CM | POA: Diagnosis not present

## 2023-12-31 DIAGNOSIS — R200 Anesthesia of skin: Secondary | ICD-10-CM | POA: Diagnosis not present

## 2023-12-31 DIAGNOSIS — K581 Irritable bowel syndrome with constipation: Secondary | ICD-10-CM | POA: Diagnosis not present

## 2023-12-31 DIAGNOSIS — F332 Major depressive disorder, recurrent severe without psychotic features: Secondary | ICD-10-CM | POA: Diagnosis not present

## 2023-12-31 DIAGNOSIS — E559 Vitamin D deficiency, unspecified: Secondary | ICD-10-CM | POA: Diagnosis not present

## 2023-12-31 DIAGNOSIS — R1013 Epigastric pain: Secondary | ICD-10-CM | POA: Diagnosis not present

## 2023-12-31 DIAGNOSIS — F419 Anxiety disorder, unspecified: Secondary | ICD-10-CM | POA: Diagnosis not present
